# Patient Record
Sex: Male | Born: 1995 | Race: White | Hispanic: No | Marital: Single | Smoking: Never smoker
Health system: Southern US, Community
[De-identification: ages and names within clinical notes are randomized; demographics above are authoritative.]

## PROBLEM LIST (undated history)

## (undated) DIAGNOSIS — T7840XA Allergy, unspecified, initial encounter: Secondary | ICD-10-CM

## (undated) HISTORY — PX: OTHER SURGICAL HISTORY: SHX169

## (undated) HISTORY — DX: Allergy, unspecified, initial encounter: T78.40XA

## (undated) HISTORY — PX: TONSILLECTOMY: SUR1361

---
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
## 2015-08-18 ENCOUNTER — Emergency Department (HOSPITAL_COMMUNITY): Payer: Medicaid Other

## 2015-08-18 ENCOUNTER — Encounter (HOSPITAL_COMMUNITY): Payer: Self-pay | Admitting: Emergency Medicine

## 2015-08-18 ENCOUNTER — Emergency Department (HOSPITAL_COMMUNITY)
Admission: EM | Admit: 2015-08-18 | Discharge: 2015-08-19 | Disposition: A | Discharge status: Home / Self Care | Payer: Self-pay | Attending: Emergency Medicine | Admitting: Emergency Medicine

## 2015-08-18 DIAGNOSIS — R112 Nausea with vomiting, unspecified: Secondary | ICD-10-CM | POA: Insufficient documentation

## 2015-08-18 DIAGNOSIS — Z791 Long term (current) use of non-steroidal anti-inflammatories (NSAID): Secondary | ICD-10-CM | POA: Insufficient documentation

## 2015-08-18 LAB — CBC WITH DIFFERENTIAL/PLATELET
BASOS ABS: 0 10*3/uL (ref 0.0–0.1)
Basophils Relative: 1 % (ref 0–1)
EOS ABS: 0.2 10*3/uL (ref 0.0–0.7)
EOS PCT: 2 % (ref 0–5)
HCT: 45.2 % (ref 39.0–52.0)
Hemoglobin: 16.2 g/dL (ref 13.0–17.0)
Lymphocytes Relative: 31 % (ref 12–46)
Lymphs Abs: 2.3 10*3/uL (ref 0.7–4.0)
MCH: 31.4 pg (ref 26.0–34.0)
MCHC: 35.8 g/dL (ref 30.0–36.0)
MCV: 87.6 fL (ref 78.0–100.0)
MONO ABS: 0.5 10*3/uL (ref 0.1–1.0)
Monocytes Relative: 7 % (ref 3–12)
Neutro Abs: 4.4 10*3/uL (ref 1.7–7.7)
Neutrophils Relative %: 59 % (ref 43–77)
PLATELETS: 313 10*3/uL (ref 150–400)
RBC: 5.16 MIL/uL (ref 4.22–5.81)
RDW: 12.2 % (ref 11.5–15.5)
WBC: 7.3 10*3/uL (ref 4.0–10.5)

## 2015-08-18 LAB — COMPREHENSIVE METABOLIC PANEL
ALT: 19 U/L (ref 17–63)
AST: 19 U/L (ref 15–41)
Albumin: 5.2 g/dL — ABNORMAL HIGH (ref 3.5–5.0)
Alkaline Phosphatase: 50 U/L (ref 38–126)
Anion gap: 8 (ref 5–15)
BILIRUBIN TOTAL: 0.6 mg/dL (ref 0.3–1.2)
BUN: 11 mg/dL (ref 6–20)
CO2: 27 mmol/L (ref 22–32)
Calcium: 9.6 mg/dL (ref 8.9–10.3)
Chloride: 103 mmol/L (ref 101–111)
Creatinine, Ser: 0.75 mg/dL (ref 0.61–1.24)
GFR calc Af Amer: >60 mL/min (ref >60)
GFR calc non Af Amer: >60 mL/min (ref >60)
Glucose, Bld: 109 mg/dL — ABNORMAL HIGH (ref 65–99)
Potassium: 3.3 mmol/L — ABNORMAL LOW (ref 3.5–5.1)
Sodium: 138 mmol/L (ref 135–145)
Total Protein: 8.5 g/dL — ABNORMAL HIGH (ref 6.5–8.1)

## 2015-08-18 LAB — LIPASE, BLOOD: Lipase: 15 U/L — ABNORMAL LOW (ref 22–51)

## 2015-08-18 MED ORDER — ONDANSETRON HCL 4 MG PO TABS: 4.0000 mg | ORAL_TABLET | Freq: Three times a day (TID) | ORAL | Status: DC | PRN

## 2015-08-18 MED ORDER — FAMOTIDINE 20 MG PO TABS: 20.0000 mg | ORAL_TABLET | Freq: Two times a day (BID) | ORAL | Status: DC

## 2015-08-18 MED ORDER — ONDANSETRON 8 MG PO TBDP
8.0000 mg | ORAL_TABLET | Freq: Once | ORAL | Status: AC
  Administered 2015-08-18: 8 mg via ORAL
  Filled 2015-08-18: qty 1

## 2015-08-18 MED ORDER — POTASSIUM CHLORIDE CRYS ER 20 MEQ PO TBCR
40.0000 meq | EXTENDED_RELEASE_TABLET | Freq: Once | ORAL | Status: AC
  Administered 2015-08-18: 40 meq via ORAL
  Filled 2015-08-18: qty 2

## 2015-08-18 NOTE — ED Notes (Signed)
Pt states he has not been able to hold any food down for the last week. Denies pain or diarrhea

## 2015-08-18 NOTE — ED Provider Notes (Signed)
CSN: [id number]     Arrival date & time 08/18/15  1907 History   First MD Initiated Contact with Patient 08/18/15 2219     Chief Complaint  Patient presents with  . Emesis     HPI Pt was seen at 2225. Per pt, c/o gradual onset and persistence of multiple intermittent episodes of N/V for the past 3 to 4 days. States he "just get nauseated and throws up." Denies diarrhea, no abd pain, no CP/SOB, no back pain, no fevers, no black or blood in stools or emesis.   History reviewed. No pertinent past medical history.   Past Surgical History  Procedure Laterality Date  . Tonsillectomy    . Tubes in ears      Social History  Substance Use Topics  . Smoking status: Never Smoker   . Smokeless tobacco: None  . Alcohol Use: No    Review of Systems ROS: Statement: All systems negative except as marked or noted in the HPI; Constitutional: Negative for fever and chills. ; ; Eyes: Negative for eye pain, redness and discharge. ; ; ENMT: Negative for ear pain, hoarseness, nasal congestion, sinus pressure and sore throat. ; ; Cardiovascular: Negative for chest pain, palpitations, diaphoresis, dyspnea and peripheral edema. ; ; Respiratory: Negative for cough, wheezing and stridor. ; ; Gastrointestinal: +N/V. Negative for diarrhea, abdominal pain, blood in stool, hematemesis, jaundice and rectal bleeding. . ; ; Genitourinary: Negative for dysuria, flank pain and hematuria. ; ; Musculoskeletal: Negative for back pain and neck pain. Negative for swelling and trauma.; ; Skin: Negative for pruritus, rash, abrasions, blisters, bruising and skin lesion.; ; Neuro: Negative for headache, lightheadedness and neck stiffness. Negative for weakness, altered level of consciousness , altered mental status, extremity weakness, paresthesias, involuntary movement, seizure and syncope.       Allergies  Review of patient's allergies indicates no known allergies.  Home Medications   Prior to Admission medications    Medication Sig Start Date End Date Taking? Authorizing Provider  cyclobenzaprine (FLEXERIL) 10 MG tablet Take 1 tablet (10 mg total) by mouth 3 (three) times daily as needed. 10/27/13   Tammy Triplett, PA-C  naproxen (NAPROSYN) 500 MG tablet Take 1 tablet (500 mg total) by mouth 2 (two) times daily. Take with food 10/27/13   Tammy Triplett, PA-C   BP 131/87 mmHg  Pulse 74  Temp(Src) 97.6 F (36.4 C) (Oral)  Resp 20  Ht  (1.727 m)  Wt 142 lb (64.411 kg)  BMI 21.60 kg/m2  SpO2 100% Physical Exam  2230; Physical examination:  Nursing notes reviewed; Vital signs and O2 SAT reviewed;  Constitutional: Well developed, Well nourished, Well hydrated, In no acute distress; Head:  Normocephalic, atraumatic; Eyes: EOMI, PERRL, No scleral icterus; ENMT: Mouth and pharynx normal, Mucous membranes moist; Neck: Supple, Full range of motion, No lymphadenopathy; Cardiovascular: Regular rate and rhythm, No murmur, rub, or gallop; Respiratory: Breath sounds clear & equal bilaterally, No rales, rhonchi, wheezes.  Speaking full sentences with ease, Normal respiratory effort/excursion; Chest: Nontender, Movement normal; Abdomen: Soft, Nontender, Nondistended, Normal bowel sounds; Genitourinary: No CVA tenderness; Extremities: Pulses normal, No tenderness, No edema, No calf edema or asymmetry.; Neuro: AA&Ox3, Major CN grossly intact.  Speech clear. No gross focal motor or sensory deficits in extremities. Climbs on and off stretcher easily by himself. Gait steady.; Skin: Color normal, Warm, Dry.   ED Course  Procedures (including critical care time) Labs Review   Imaging Review  I have personally reviewed and evaluated  these images and lab results as part of my medical decision-making.   EKG Interpretation None      MDM  MDM Reviewed: previous chart, nursing note and vitals Reviewed previous: labs Interpretation: labs     Results for orders placed or performed during the hospital encounter of  08/18/15  Comprehensive metabolic panel  Result Value Ref Range   Sodium 138 135 - 145 mmol/L   Potassium 3.3 (L) 3.5 - 5.1 mmol/L   Chloride 103 101 - 111 mmol/L   CO2 27 22 - 32 mmol/L   Glucose, Bld 109 (H) 65 - 99 mg/dL   BUN 11 6 - 20 mg/dL   Creatinine, Ser 1.61 0.61 - 1.24 mg/dL   Calcium 9.6 8.9 - 09.6 mg/dL   Total Protein 8.5 (H) 6.5 - 8.1 g/dL   Albumin 5.2 (H) 3.5 - 5.0 g/dL   AST 19 15 - 41 U/L   ALT 19 17 - 63 U/L   Alkaline Phosphatase 50 38 - 126 U/L   Total Bilirubin 0.6 0.3 - 1.2 mg/dL   GFR calc non Af Amer >60 >60 mL/min   GFR calc Af Amer >60 >60 mL/min   Anion gap 8 5 - 15  Lipase, blood  Result Value Ref Range   Lipase 15 (L) 22 - 51 U/L  CBC with Differential  Result Value Ref Range   WBC 7.3 4.0 - 10.5 K/uL   RBC 5.16 4.22 - 5.81 MIL/uL   Hemoglobin 16.2 13.0 - 17.0 g/dL   HCT 04.5 40.9 - 81.1 %   MCV 87.6 78.0 - 100.0 fL   MCH 31.4 26.0 - 34.0 pg   MCHC 35.8 30.0 - 36.0 g/dL   RDW 91.4 78.2 - 95.6 %   Platelets 313 150 - 400 K/uL   Neutrophils Relative % 59 43 - 77 %   Neutro Abs 4.4 1.7 - 7.7 K/uL   Lymphocytes Relative 31 12 - 46 %   Lymphs Abs 2.3 0.7 - 4.0 K/uL   Monocytes Relative 7 3 - 12 %   Monocytes Absolute 0.5 0.1 - 1.0 K/uL   Eosinophils Relative 2 0 - 5 %   Eosinophils Absolute 0.2 0.0 - 0.7 K/uL   Basophils Relative 1 0 - 1 %   Basophils Absolute 0.0 0.0 - 0.1 K/uL   Dg Abd Acute W/chest 08/18/2015   CLINICAL DATA:  Nausea vomiting for 1 week.  EXAM: DG ABDOMEN ACUTE W/ 1V CHEST  COMPARISON:  None.  FINDINGS: There is no evidence of dilated bowel loops or free intraperitoneal air. No radiopaque calculi or other significant radiographic abnormality is seen. Heart size and mediastinal contours are within normal limits. Both lungs are clear.  IMPRESSION: Negative abdominal radiographs.  No acute cardiopulmonary disease.   Electronically Signed   By: Ellery Plunk M.D.   On: 08/18/2015 22:52    2340:  Potassium repleted PO. Pt  has tol PO well while in the ED without N/V.  No stooling while in the ED.  Abd remains benign, VSS. Feels better and wants to go home now. Dx and testing d/w pt and family.  Questions answered.  Verb understanding, agreeable to d/c home with outpt f/u.     Samuel Jester, DO 08/22/15 [phone]

## 2015-08-18 NOTE — Discharge Instructions (Signed)
°Emergency Department Resource Guide °1) Find a Doctor and Pay Out of Pocket °Although you won't have to find out who is covered by your insurance plan, it is a good idea to ask around and get recommendations. You will then need to call the office and see if the doctor you have chosen will accept you as a new patient and what types of options they offer for patients who are self-pay. Some doctors offer discounts or will set up payment plans for their patients who do not have insurance, but you will need to ask so you aren't surprised when you get to your appointment. ° °2) Contact Your Local Health Department °Not all health departments have doctors that can see patients for sick visits, but many do, so it is worth a call to see if yours does. If you don't know where your local health department is, you can check in your phone book. The CDC also has a tool to help you locate your state's health department, and many state websites also have listings of all of their local health departments. ° °3) Find a Walk-in Clinic °If your illness is not likely to be very severe or complicated, you may want to try a walk in clinic. These are popping up all over the country in pharmacies, drugstores, and shopping centers. They're usually staffed by nurse practitioners or physician assistants that have been trained to treat common illnesses and complaints. They're usually fairly quick and inexpensive. However, if you have serious medical issues or chronic medical problems, these are probably not your best option. ° °No Primary Care Doctor: °- Call [facility] at  832-8000 - they can help you locate a primary care doctor that  accepts your insurance, provides certain services, etc. °- Physician Referral Service- 1-800-533-3463 ° °Chronic Pain Problems: °Organization         Address  Phone   Notes  °[facility]  (336) 297-2271 Patients need to be referred by their primary care doctor.  ° °Medication  Assistance: °Organization         Address  Phone   Notes  °[place] [facility] [address] °[place], [place] [address] (336) 641-8030 --Must be a resident of [place] °-- Must have NO insurance coverage whatsoever (no Medicaid/ Medicare, etc.) °-- The pt. MUST have a primary care doctor that directs their care regularly and follows them in the community °  °[facility]  (866) 331-1348   °[employer]  (888) 892-1162   ° °Agencies that provide inexpensive medical care: °Organization         Address  Phone   Notes  °[facility]  (336) 832-8035   °[facility]    (336) 832-7272   °[facility] [address] °[place], [place] [address] (336) 832-4777   °[facility] [address], °[place] (336) 271-4999   °[facility]    (336) 373-0678   °[facility]    (336) 272-1050   °[facility] ° [address], [place] Phone:  (336) 832-4444, Fax:  (336) 832-4440 Hours of Operation:  9 am - 6 pm, M-F.  Also accepts Medicaid/Medicare and self-pay.  °[facility] ° [address], [place] Phone: (336) 832-3150, Fax: (336) [phone]. Hours of Operation:  8:30 am - 5:30 pm, M-F.  Also accepts Medicaid and self-pay.  °[facility] [place] [address], [place] Phone: (336) 878-6027   °[facility] [address], [place], [place] (336)723-1848, Ext. 123 Mondays & Thursdays: 7-9 AM.  First 15 patients are seen on a first come, first serve basis. °  ° °Medicaid-accepting [place] Providers: ° °Organization         Address  Phone   Notes  °[facility] [address], [place] (336) 641-2100 Also accepts self-pay patients.  °[facility] [address], [place] ° (336) 856-9996   °[facility] [address], [place]  (336) 288-8857   °[facility] [address] [place] [address], [place] (336) 299-7000   °Veita Bland [address], [place]  ° (336) 373-1557 Only accepts [place] Access Medicaid patients after they have their name applied to their card.  ° °Self-Pay (no insurance) in [place]: ° °Organization         Address  Phone   Notes  °Sickle Cell Patients, [facility] [address], [place] (336) 832-1970   °[facility] [address], [place] (336) 832-4400   °[facility] ° [address], [place] (336) 992-4800   °[facility]/Dr. Osei-Bonsu ° [address] [place] [address], [place] or [address], [place] (336) 841-8500 Phone number for both [place] and [place] locations is the same.  °[facility] [address], [place] (336) 299-0000   °[facility] [address] [place] [address], [place] or [address] (336) 852-7530 °(336) 878-2260   °[facility] [address], [place] (336) 350-1642, phone; (336) 294-5005, fax Sees patients 1st and 3rd Saturday of every month.  Must not qualify for public or private insurance (i.e. Medicaid, Medicare, [place] Health Choice, Veterans' Benefits) • Household income should be no more than 200% of the poverty level •The clinic cannot treat you if you are pregnant or think you are pregnant • Sexually transmitted diseases are not treated at the clinic.  ° ° °Dental Care: °Organization         Address  Phone  Notes  °[place] [facility] [address], [place] (336) 641-6152 Accepts children up to age 21 who are enrolled in Medicaid or [place] Health Choice; pregnant women with a Medicaid card; and children who have applied for Medicaid or [place] Health Choice, but were declined, whose parents can pay a reduced fee at time of service.  °[place]  [facility] [place]  [address], [place] (336) 641-7733 Accepts children up to age 21 who are enrolled in Medicaid or [place] Health Choice; pregnant women with a Medicaid card; and children who have applied for Medicaid or [place] Health Choice, but were declined, whose parents can pay a reduced fee at time of service.  °[facility] ° [address], [place] (336) 641-4533 Patients are seen by appointment only. Walk-ins are not accepted. [facility] will see patients 18 years of age and older. °Monday - Tuesday (8am-5pm) °Most Wednesdays (8:30-5pm) °$30 per visit, cash only  °[facility] ° [address], [place] (336) 641-4533 Patients are seen by appointment only. Walk-ins are not accepted. [facility] will see patients 18 years of age and older. °One   Wednesday Evening (Monthly: Volunteer Based).  $30 per visit, cash only  °[facility]  (919) 537-3737 for adults; Children under age 4, call Graduate Pediatric Dentistry at (919) [phone]. Children aged 4-14, please call (919) [phone] to request a pediatric application. ° Dental services are provided in all areas of dental care including fillings, crowns and bridges, complete and partial dentures, implants, gum treatment, root canals, and extractions. Preventive care is also provided. Treatment is provided to both adults and children. °Patients are selected via a lottery and there is often a waiting list. °  °[facility] [address], °[place] ° (336) 763-8833 [email or web address].[email or web address] °  °[facility] [address], [place], [place] (336)723-1848, Ext. 123 Second and Fourth Thursday of each month, opens at 6:30 AM; Clinic ends at 9 AM.  Patients are seen on a first-come first-served basis, and a limited number are seen during each clinic.  ° °[facility] ° [address], [place], [place] (336) 723-7904    Eligibility Requirements °You must have lived in [place], or [place] counties for at least the last three months. °  You cannot be eligible for state or federal sponsored healthcare insurance, including Veterans Administration, Medicaid, or Medicare. °  You generally cannot be eligible for healthcare insurance through your employer.  °  How to apply: °Eligibility screenings are held every Tuesday and Wednesday afternoon from 1:00 pm until 4:00 pm. You do not need an appointment for the interview!  °[facility] [address], [place], [place] 336-631-2330   °[facility]  336-342-8273   °[facility]  336-703-3100   °[facility]  336-570-6415   ° °Behavioral Health Resources in the Community: °Intensive Outpatient Programs °Organization         Address  Phone  Notes  °[facility] [address], [place], [place] 336-878-6098   °[facility] [address], [place], [place] 336-832-9800   °[facility] [address], [place], [place] ° 336-882-2125   °[facility] [address],  °[place], [place] 1-800-853-5163 or 336-641-4981   °Substance Abuse Resources °Organization         Address  Phone  Notes  °[facility]  336-882-2125   °[facility]  336-784-9470   °[facility]  336-285-9073   °[facility]  336-845-3988   °Residential & Outpatient Substance Abuse Program  1-800-659-3381   °Psychological Services °Organization         Address  Phone  Notes  °[facility]  336- 832-9600   °[facility]  336- 378-7881   °[facility] [address], [place] 1-800-853-5163 or 336-641-4981   ° °Mobile Crisis Teams °Organization         Address  Phone  Notes  °[facility], Mobile Crisis Care Unit  1-877-626-1772   °Assertive °[facility] ° [address]  [place], [place] 336-834-9664   °Sharon DeEsch [address] °[place] 336-554-5454   ° °Self-Help/Support Groups °Organization         Address  Phone             Notes  °[facility]  - variety of support groups  336- 373-1402 Call for more information  °Narcotics Anonymous (NA), [facility] [address], °[place]  2 meetings at this location  ° °  Residential Treatment Programs Organization         Address  Phone  Notes  [facility] [address]    [place]  1-610-960-4540   [facility]  [address] [place] [address], [place], [place] 478-295-6213   [facility] [address] [place], [place] 086-578-4696 Admissions: 8am-3pm M-F  [facility] [address],    [place], [place] 295-284-1324   [facility] [address] [place], [place], [place] 401-027-2536   [facility] [address],  [place], [place] 644-034-7425   [facility] Intensive Outpatient [address], [place], [place] 956-387-5643   [facility] ([facility] [address] [place].,  [place], [place] 3-295-188-4166 or (571)780-8777   [facility] ([facility] [address] [place], [place] 323-557-3220 Accepts Medicaid  [facility] [address],  [place] 2-542-706-2376 Substance Abuse/Addiction Treatment   [facility] Organization         Address  Phone  Notes  [facility]  (850) 814-2697   Angie Fava, PhD [address] [place], [place]   (575)388-8666 or (947) 515-7885   [facility]   [address] [place], [place] (601)[phone]   [facility] [address], [place], [place] [phone] Insurance/Medicaid/sponsorship through [facility] Families [address]                                    [place], [place] [phone] Therapy/tele-psych/case    [facility] [address], [place] (860) [phone]    Dr. Lolly Mustache  (260) [phone]   [facility]  [employer] [facility] Dept. 1) [address], [place] 2) [address], [place] 3)  [address], [place] [phone]  (225) [phone]   [facility] Child Abuse Hotline 606 532 3311 or (743) 666-0126 (After Hours)      Take the prescriptions as directed.  Increase your fluid intake (ie:  Gatoraide) for the next few days.  Eat a bland diet, avoiding greasy, fatty, fried foods, as well as spicy and acidic foods or beverages.  Avoid eating within the hour or 2 before going to bed or laying down.  Also avoid teas, colas, coffee, chocolate, pepermint and spearment.  Call your regular medical doctor and the GI doctor tomorrow to schedule a follow up appointment within the next week.  Return to the Emergency Department immediately if worsening.

## 2015-08-19 NOTE — ED Notes (Signed)
Patient verbalizes understanding of discharge instructions, prescription medications, home care and follow up care. Patient ambulatory out of department at this time with family. 

## 2016-08-11 ENCOUNTER — Emergency Department (HOSPITAL_COMMUNITY)
Admission: EM | Admit: 2016-08-11 | Discharge: 2016-08-11 | Disposition: A | Discharge status: Home / Self Care | Payer: No Typology Code available for payment source | Attending: Emergency Medicine | Admitting: Emergency Medicine

## 2016-08-11 ENCOUNTER — Encounter (HOSPITAL_COMMUNITY): Payer: Self-pay | Admitting: Emergency Medicine

## 2016-08-11 DIAGNOSIS — Y999 Unspecified external cause status: Secondary | ICD-10-CM | POA: Diagnosis not present

## 2016-08-11 DIAGNOSIS — Y939 Activity, unspecified: Secondary | ICD-10-CM | POA: Diagnosis not present

## 2016-08-11 DIAGNOSIS — M545 Low back pain: Secondary | ICD-10-CM | POA: Diagnosis present

## 2016-08-11 DIAGNOSIS — Y92410 Unspecified street and highway as the place of occurrence of the external cause: Secondary | ICD-10-CM | POA: Diagnosis not present

## 2016-08-11 DIAGNOSIS — S39012A Strain of muscle, fascia and tendon of lower back, initial encounter: Secondary | ICD-10-CM | POA: Diagnosis not present

## 2016-08-11 DIAGNOSIS — Z79899 Other long term (current) drug therapy: Secondary | ICD-10-CM | POA: Insufficient documentation

## 2016-08-11 MED ORDER — CYCLOBENZAPRINE HCL 10 MG PO TABS: 10.0000 mg | ORAL_TABLET | Freq: Three times a day (TID) | ORAL | 0 refills | Status: DC | PRN

## 2016-08-11 NOTE — Discharge Instructions (Signed)
Apply ice packs on/off to your back.  Take ibuprofen 600 to 800 mg with food three times a day as needed.  Follow-up with your doctor for recheck if needed
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
## 2016-10-19 ENCOUNTER — Ambulatory Visit: Payer: Self-pay | Admitting: Physician Assistant

## 2016-10-20 ENCOUNTER — Encounter: Payer: Self-pay | Admitting: Physician Assistant

## 2016-10-20 ENCOUNTER — Telehealth: Payer: Self-pay | Admitting: Physician Assistant

## 2016-10-20 NOTE — Telephone Encounter (Signed)
Patient had to go to urgent care on Wednesday and forgot to call and cancel appointment.

## 2017-04-02 ENCOUNTER — Other Ambulatory Visit: Payer: Self-pay | Admitting: Occupational Medicine

## 2017-04-02 ENCOUNTER — Ambulatory Visit: Payer: Self-pay

## 2017-04-02 DIAGNOSIS — M79644 Pain in right finger(s): Secondary | ICD-10-CM

## 2018-11-29 ENCOUNTER — Encounter: Payer: Self-pay | Admitting: Physician Assistant

## 2018-11-29 ENCOUNTER — Ambulatory Visit: Payer: Self-pay | Admitting: Physician Assistant

## 2018-11-29 VITALS — BP 125/77 | HR 85 | Temp 97.2°F | Ht 68.0 in | Wt 145.8 lb

## 2018-11-29 DIAGNOSIS — J301 Allergic rhinitis due to pollen: Secondary | ICD-10-CM

## 2018-11-29 DIAGNOSIS — K2100 Gastro-esophageal reflux disease with esophagitis, without bleeding: Secondary | ICD-10-CM

## 2018-11-29 MED ORDER — FLUTICASONE PROPIONATE 50 MCG/ACT NA SUSP: 2.0000 | Freq: Every day | NASAL | 6 refills | Status: DC

## 2018-11-29 MED ORDER — OMEPRAZOLE 20 MG PO CPDR
20.0000 mg | DELAYED_RELEASE_CAPSULE | Freq: Every day | ORAL | 5 refills | Status: DC
Start: 2018-11-29 — End: 2019-02-21

## 2018-11-29 MED ORDER — CETIRIZINE HCL 10 MG PO TABS
10.0000 mg | ORAL_TABLET | Freq: Every day | ORAL | 11 refills | Status: DC
Start: 2018-11-29 — End: 2021-03-25

## 2018-11-29 NOTE — Patient Instructions (Signed)

## 2018-11-29 NOTE — Progress Notes (Signed)
BP 125/77   Pulse 85   Temp (!) 97.2 F (36.2 C) (Oral)   Ht 5\' 8"  (1.727 m)   Wt 145 lb 12.8 oz (66.1 kg)   BMI 22.17 kg/m    Subjective:    Patient ID: Sean Gilbert, adult    DOB: 1996-03-01, 22 y.o.   MRN: [id number]  HPI: Sean Gilbert is a 22 y.o. adult presenting on 11/29/2018 for New Patient (Initial Visit); Sinus Problem; and Gastroesophageal Reflux  Patient comes in as a new patient to be established.  He has had longstanding sinus and allergy problems.  Years ago he used to take right many medicines but has been able to go without them.  However at this time he is having a significant amount of sinus congestion most all the time.  He is tried Claritin without any relief.  Casimer BilisSerena stop that medication.  He has not used Flonase in some time.  And he has never taken Zyrtec before.  In addition he is having significant reflux symptoms of epigastric pain and even up into the esophagus.  At times he does have difficulty with swallowing.  He will retakes food.  History reviewed. No pertinent past medical history. Relevant past medical, surgical, family and social history reviewed and updated as indicated. Interim medical history since our last visit reviewed. Allergies and medications reviewed and updated. DATA REVIEWED: CHART IN EPIC  Family History reviewed for pertinent findings.  Review of Systems  Constitutional: Negative.  Negative for appetite change and fatigue.  HENT: Positive for congestion, postnasal drip and rhinorrhea.   Eyes: Negative.  Negative for pain and visual disturbance.  Respiratory: Negative.  Negative for cough, chest tightness, shortness of breath and wheezing.   Cardiovascular: Negative.  Negative for chest pain, palpitations and leg swelling.  Gastrointestinal: Negative.  Negative for abdominal pain, diarrhea, nausea and vomiting.  Endocrine: Negative.   Genitourinary: Negative.   Musculoskeletal: Negative.   Skin: Negative.  Negative  for color change and rash.  Neurological: Negative.  Negative for weakness, numbness and headaches.  Psychiatric/Behavioral: Negative.     Allergies as of 11/29/2018   No Known Allergies     Medication List       Accurate as of November 29, 2018  5:57 PM. Always use your most recent med list.        cetirizine 10 MG tablet Commonly known as:  ZYRTEC Take 1 tablet (10 mg total) by mouth daily.   fluticasone 50 MCG/ACT nasal spray Commonly known as:  FLONASE Place 2 sprays into both nostrils daily.   lansoprazole 15 MG capsule Commonly known as:  PREVACID Take 15 mg by mouth daily at 12 noon.   omeprazole 20 MG capsule Commonly known as:  PRILOSEC Take 1 capsule (20 mg total) by mouth daily.          Objective:    BP 125/77   Pulse 85   Temp (!) 97.2 F (36.2 C) (Oral)   Ht 5\' 8"  (1.727 m)   Wt 145 lb 12.8 oz (66.1 kg)   BMI 22.17 kg/m   No Known Allergies  Wt Readings from Last 3 Encounters:  11/29/18 145 lb 12.8 oz (66.1 kg)  08/11/16 150 lb (68 kg)  08/18/15 142 lb (64.4 kg) (28 %, Z= -0.59)*   * Growth percentiles are based on CDC (Boys, 2-20 Years) data.    Physical Exam Constitutional:      Appearance: She is well-developed.  HENT:  Head: Normocephalic and atraumatic.     Nose: Congestion and rhinorrhea present. No mucosal edema.  Eyes:     Conjunctiva/sclera: Conjunctivae normal.     Pupils: Pupils are equal, round, and reactive to light.  Neck:     Musculoskeletal: Normal range of motion and neck supple.  Cardiovascular:     Rate and Rhythm: Normal rate and regular rhythm.     Heart sounds: Normal heart sounds.  Pulmonary:     Effort: Pulmonary effort is normal.     Breath sounds: Normal breath sounds.  Abdominal:     General: Bowel sounds are normal.     Palpations: Abdomen is soft.  Musculoskeletal: Normal range of motion.  Skin:    General: Skin is warm and dry.         Assessment & Plan:   1. Gastroesophageal reflux  disease with esophagitis - lansoprazole (PREVACID) 15 MG capsule; Take 15 mg by mouth daily at 12 noon. Stop this medication  - omeprazole (PRILOSEC) 20 MG capsule; Take 1 capsule (20 mg total) by mouth daily.  Dispense: 30 capsule; Refill: 5  2. Non-seasonal allergic rhinitis due to pollen - fluticasone (FLONASE) 50 MCG/ACT nasal spray; Place 2 sprays into both nostrils daily.  Dispense: 16 g; Refill: 6 - cetirizine (ZYRTEC) 10 MG tablet; Take 1 tablet (10 mg total) by mouth daily.  Dispense: 30 tablet; Refill: 11   Continue all other maintenance medications as listed above.  Follow up plan: Return in about 4 weeks (around 12/27/2018) for recheck.  Educational handout given for survey  Remus Loffler PA-C [facility] [address]  [place], [place] [address] [phone]   11/29/2018, 5:57 PM

## 2018-12-16 ENCOUNTER — Encounter: Payer: Self-pay | Admitting: Family Medicine

## 2018-12-16 ENCOUNTER — Ambulatory Visit (INDEPENDENT_AMBULATORY_CARE_PROVIDER_SITE_OTHER): Payer: 59 | Admitting: Family Medicine

## 2018-12-16 VITALS — BP 119/78 | HR 87 | Temp 97.1°F | Ht 68.0 in | Wt 147.8 lb

## 2018-12-16 DIAGNOSIS — R0789 Other chest pain: Secondary | ICD-10-CM

## 2018-12-16 DIAGNOSIS — R071 Chest pain on breathing: Secondary | ICD-10-CM

## 2018-12-16 MED ORDER — PREDNISONE 10 MG PO TABS: ORAL_TABLET | ORAL | 0 refills | Status: DC

## 2018-12-16 NOTE — Progress Notes (Signed)
Chief Complaint  Patient presents with  . pain left chest, muscle strain?    HPI  Patient presents today for started 3 days ago with a sharp pain at the left sternal border.  It started out as twinges.  Yesterday it got so severe that he could not lift at all.  He had to leave work.  He was off today.  Lifting anything more than a pound or 2 causes intense pain in the left side of the chest patient points to the area of the costochondral joints on the left.  He says it does hurt when he tries to take a deep breath.  PMH: Smoking status noted ROS: Per HPI  Objective: BP 119/78   Pulse 87   Temp (!) 97.1 F (36.2 C) (Oral)   Ht 5\' 8"  (1.727 m)   Wt 147 lb 12.8 oz (67 kg)   BMI 22.47 kg/m  Gen: NAD, alert, cooperative with exam HEENT: NCAT, EOMI, PERRL CV: RRR, good S1/S2, no murmur Resp: CTABL, no wheezes, non-labored.  There is some tenderness for sternal compression Ext: No edema, warm Neuro: Alert and oriented, No gross deficits  Assessment and plan:  1. Costochondral chest pain     Meds ordered this encounter  Medications  . predniSONE (DELTASONE) 10 MG tablet    Sig: Take 5 daily for 3 days followed by 4,3,2 and 1 for 3 days each.    Dispense:  45 tablet    Refill:  0      Follow up as needed.  Mechele ClaudeWarren Marky Buresh, MD

## 2018-12-19 ENCOUNTER — Telehealth: Payer: Self-pay | Admitting: Physician Assistant

## 2018-12-19 NOTE — Telephone Encounter (Signed)
Ok to extend note. If his symptoms are worse, he does need to be reevaluated.

## 2018-12-19 NOTE — Telephone Encounter (Signed)
Pt was given letter to be out of work 12/29-12/30 and to return 12/31 when he was seen with Dr Darlyn Read. Ok to do another letter for being out 12/29-1/6 or does he ntbs again?

## 2018-12-19 NOTE — Telephone Encounter (Signed)
Pt advised of provider feedback and letter printed and ready for pick up.

## 2018-12-25 ENCOUNTER — Encounter: Payer: Self-pay | Admitting: Family Medicine

## 2018-12-25 ENCOUNTER — Ambulatory Visit (INDEPENDENT_AMBULATORY_CARE_PROVIDER_SITE_OTHER): Payer: 59 | Admitting: Family Medicine

## 2018-12-25 ENCOUNTER — Other Ambulatory Visit: Payer: Self-pay | Admitting: Family Medicine

## 2018-12-25 ENCOUNTER — Ambulatory Visit (INDEPENDENT_AMBULATORY_CARE_PROVIDER_SITE_OTHER): Payer: 59

## 2018-12-25 VITALS — BP 131/88 | HR 94 | Temp 97.0°F | Ht 68.0 in | Wt 150.6 lb

## 2018-12-25 DIAGNOSIS — Z029 Encounter for administrative examinations, unspecified: Secondary | ICD-10-CM

## 2018-12-25 DIAGNOSIS — S29011A Strain of muscle and tendon of front wall of thorax, initial encounter: Secondary | ICD-10-CM

## 2018-12-25 DIAGNOSIS — S96919D Strain of unspecified muscle and tendon at ankle and foot level, unspecified foot, subsequent encounter: Secondary | ICD-10-CM

## 2018-12-25 MED ORDER — TIZANIDINE HCL 2 MG PO CAPS
2.0000 mg | ORAL_CAPSULE | Freq: Three times a day (TID) | ORAL | 1 refills | Status: DC
Start: 2018-12-25 — End: 2019-08-08

## 2018-12-25 NOTE — Progress Notes (Addendum)
BP 131/88   Pulse 94   Temp (!) 97 F (36.1 C) (Oral)   Ht 5\' 8"  (1.727 m)   Wt 150 lb 9.6 oz (68.3 kg)   BMI 22.90 kg/m    Subjective:    Patient ID: Sean Gilbert, male    DOB: 05-15-1996, 23 y.o.   MRN: [id number]  HPI: Sean FantiChristopher S Seabolt is a 23 y.o. male presenting on 12/25/2018 for Chest Pain (Patient was seen 12/30 and states he feels like his chest pain has spread)   HPI Patient is coming in complaining of chest wall pain on the left side that sometimes comes over the right side as well that he has been having for a week now.  He says he was at work lifting a lot of things and that it started hurting on that day that he came in on the 30th and was seen and given prednisone.  Has improved since the prednisone but not resolved and is not as sharp but it is still there some.  He says it is worse with movement and deep inspiration and also with movement of his left arm and lifting things at work.  He did try to go back to work yesterday but it did not go well.  Relevant past medical, surgical, family and social history reviewed and updated as indicated. Interim medical history since our last visit reviewed. Allergies and medications reviewed and updated.  Review of Systems  Constitutional: Negative for chills and fever.  Respiratory: Negative for shortness of breath and wheezing.   Cardiovascular: Positive for chest pain. Negative for leg swelling.  Musculoskeletal: Negative for back pain and gait problem.  Skin: Negative for rash.  All other systems reviewed and are negative.   Per HPI unless specifically indicated above   Allergies as of 12/25/2018   No Known Allergies     Medication List       Accurate as of December 25, 2018 11:59 PM. Always use your most recent med list.        cetirizine 10 MG tablet Commonly known as:  ZYRTEC Take 1 tablet (10 mg total) by mouth daily.   fluticasone 50 MCG/ACT nasal spray Commonly known as:  FLONASE Place 2 sprays  into both nostrils daily.   lansoprazole 15 MG capsule Commonly known as:  PREVACID Take 15 mg by mouth daily at 12 noon.   omeprazole 20 MG capsule Commonly known as:  PRILOSEC Take 1 capsule (20 mg total) by mouth daily.   predniSONE 10 MG tablet Commonly known as:  DELTASONE Take 5 daily for 3 days followed by 4,3,2 and 1 for 3 days each.   tizanidine 2 MG capsule Commonly known as:  ZANAFLEX Take 1 capsule (2 mg total) by mouth 3 (three) times daily.          Objective:    BP 131/88   Pulse 94   Temp (!) 97 F (36.1 C) (Oral)   Ht 5\' 8"  (1.727 m)   Wt 150 lb 9.6 oz (68.3 kg)   BMI 22.90 kg/m   Wt Readings from Last 3 Encounters:  12/25/18 150 lb 9.6 oz (68.3 kg)  12/16/18 147 lb 12.8 oz (67 kg)  11/29/18 145 lb 12.8 oz (66.1 kg)    Physical Exam Vitals signs and nursing note reviewed.  Constitutional:      General: He is not in acute distress.    Appearance: He is well-developed. He is not diaphoretic.  Eyes:  General: No scleral icterus.    Conjunctiva/sclera: Conjunctivae normal.  Neck:     Musculoskeletal: Neck supple.     Thyroid: No thyromegaly.  Cardiovascular:     Rate and Rhythm: Normal rate and regular rhythm.     Heart sounds: Normal heart sounds. No murmur.  Pulmonary:     Effort: Pulmonary effort is normal. No respiratory distress.     Breath sounds: Normal breath sounds. No wheezing.  Chest:     Chest wall: Tenderness (Left lower chest wall tenderness to palpation, likely musculoskeletal in origin) present.  Musculoskeletal: Normal range of motion.  Lymphadenopathy:     Cervical: No cervical adenopathy.  Skin:    General: Skin is warm and dry.     Findings: No rash.  Neurological:     Mental Status: He is alert and oriented to person, place, and time.     Coordination: Coordination normal.  Psychiatric:        Behavior: Behavior normal.     Chest x-ray: No acute bony abnormalities or cardiopulmonary abnormalities, await final  read from radiologist    Assessment & Plan:   Problem List Items Addressed This Visit    None    Visit Diagnoses    Muscle strain of chest wall, initial encounter    -  Primary   Relevant Medications   tizanidine (ZANAFLEX) 2 MG capsule      Will give 1 more week off work or light duty if they can accommodate him.  Use the muscle relaxer and if still causing a lot of issues in a week then we can send him to sports medicine or physical therapy. Follow up plan: Return if symptoms worsen or fail to improve.  Counseling provided for all of the vaccine components No orders of the defined types were placed in this encounter.   Arville Care, MD [facility] 12/26/2018, 3:16 PM

## 2018-12-26 ENCOUNTER — Telehealth: Payer: Self-pay | Admitting: Physician Assistant

## 2018-12-26 NOTE — Telephone Encounter (Signed)
Form has been received.

## 2018-12-31 ENCOUNTER — Ambulatory Visit (INDEPENDENT_AMBULATORY_CARE_PROVIDER_SITE_OTHER): Payer: 59 | Admitting: Physician Assistant

## 2018-12-31 ENCOUNTER — Encounter: Payer: Self-pay | Admitting: Physician Assistant

## 2018-12-31 VITALS — BP 126/81 | HR 68 | Temp 96.3°F | Ht 68.0 in | Wt 150.0 lb

## 2018-12-31 DIAGNOSIS — K2100 Gastro-esophageal reflux disease with esophagitis, without bleeding: Secondary | ICD-10-CM

## 2018-12-31 DIAGNOSIS — G4700 Insomnia, unspecified: Secondary | ICD-10-CM | POA: Diagnosis not present

## 2018-12-31 DIAGNOSIS — G4726 Circadian rhythm sleep disorder, shift work type: Secondary | ICD-10-CM

## 2018-12-31 DIAGNOSIS — R002 Palpitations: Secondary | ICD-10-CM | POA: Diagnosis not present

## 2018-12-31 DIAGNOSIS — J301 Allergic rhinitis due to pollen: Secondary | ICD-10-CM

## 2018-12-31 NOTE — Progress Notes (Signed)
BP 126/81   Pulse 68   Temp (!) 96.3 F (35.7 C) (Oral)   Ht 5\' 8"  (1.727 m)   Wt 150 lb (68 kg)   BMI 22.81 kg/m    Subjective:    Patient ID: Sean Gilbert, male    DOB: February 24, 1996, 23 y.o.   MRN: [id number]  HPI: Sean Gilbert is a 23 y.o. male presenting on 12/31/2018 for Gastroesophageal Reflux (4 week follow up)  This patient comes in for recheck on his GERD and states that it has improved and when he has been off of night shift it has been better. However he is having more issues whenever he works the night shift.  He is having significant palpitations and states that he works at night and is having significant issues with his sleep.  He had always had a little bit of problem with insomnia but since working night shift it is gotten much worse.  I do think he should try to get the first shift as soon as he can.  He states that we can send a letter to his work and we will complete some paperwork for him when he gets into our office.   History reviewed. No pertinent past medical history. Relevant past medical, surgical, family and social history reviewed and updated as indicated. Interim medical history since our last visit reviewed. Allergies and medications reviewed and updated. DATA REVIEWED: CHART IN EPIC  Family History reviewed for pertinent findings.  Review of Systems  Constitutional: Positive for fatigue. Negative for appetite change.  HENT: Negative.   Eyes: Negative.  Negative for pain and visual disturbance.  Respiratory: Negative.  Negative for cough, chest tightness, shortness of breath and wheezing.   Cardiovascular: Positive for chest pain and palpitations. Negative for leg swelling.  Gastrointestinal: Negative.  Negative for abdominal pain, diarrhea, nausea and vomiting.  Endocrine: Negative.   Genitourinary: Negative.   Musculoskeletal: Positive for arthralgias.  Skin: Negative.  Negative for color change and rash.  Neurological:  Negative.  Negative for weakness, numbness and headaches.  Psychiatric/Behavioral: Positive for sleep disturbance. Negative for suicidal ideas. The patient is nervous/anxious.     Allergies as of 12/31/2018   No Known Allergies     Medication List       Accurate as of December 31, 2018 11:59 PM. Always use your most recent med list.        cetirizine 10 MG tablet Commonly known as:  ZYRTEC Take 1 tablet (10 mg total) by mouth daily.   fluticasone 50 MCG/ACT nasal spray Commonly known as:  FLONASE Place 2 sprays into both nostrils daily.   lansoprazole 15 MG capsule Commonly known as:  PREVACID Take 15 mg by mouth daily at 12 noon.   omeprazole 20 MG capsule Commonly known as:  PRILOSEC Take 1 capsule (20 mg total) by mouth daily.   predniSONE 10 MG tablet Commonly known as:  DELTASONE Take 5 daily for 3 days followed by 4,3,2 and 1 for 3 days each.   tizanidine 2 MG capsule Commonly known as:  ZANAFLEX Take 1 capsule (2 mg total) by mouth 3 (three) times daily.          Objective:    BP 126/81   Pulse 68   Temp (!) 96.3 F (35.7 C) (Oral)   Ht 5\' 8"  (1.727 m)   Wt 150 lb (68 kg)   BMI 22.81 kg/m   No Known Allergies  Wt Readings from Last  3 Encounters:  12/31/18 150 lb (68 kg)  12/25/18 150 lb 9.6 oz (68.3 kg)  12/16/18 147 lb 12.8 oz (67 kg)    Physical Exam Constitutional:      Appearance: He is well-developed.  HENT:     Head: Normocephalic and atraumatic.  Eyes:     Conjunctiva/sclera: Conjunctivae normal.     Pupils: Pupils are equal, round, and reactive to light.  Neck:     Musculoskeletal: Normal range of motion and neck supple.  Cardiovascular:     Rate and Rhythm: Normal rate and regular rhythm.     Heart sounds: Normal heart sounds.  Pulmonary:     Effort: Pulmonary effort is normal.     Breath sounds: Normal breath sounds.  Abdominal:     General: Bowel sounds are normal.     Palpations: Abdomen is soft.  Musculoskeletal: Normal  range of motion.  Skin:    General: Skin is warm and dry.         Assessment & Plan:   1. Palpitations Try to get off night shift  2. Insomnia, unspecified type Try to get off night shift  3. Shift work sleep disorder Try to get off night shift  4. Gastroesophageal reflux disease with esophagitis Try to get off night shift  5. Non-seasonal allergic rhinitis due to pollen Try to get off night shift   Continue all other maintenance medications as listed above.  Follow up plan: Return in about 3 months (around 04/01/2019).  Educational handout given for survey  Remus Loffler PA-C [facility] [address]  [place], [place] [address] [phone]   01/03/2019, 10:31 AM

## 2019-01-01 ENCOUNTER — Other Ambulatory Visit: Payer: Self-pay | Admitting: *Deleted

## 2019-01-01 NOTE — Telephone Encounter (Signed)
erroneous

## 2019-01-02 ENCOUNTER — Telehealth: Payer: Self-pay | Admitting: Physician Assistant

## 2019-01-02 NOTE — Telephone Encounter (Signed)
Form is ready, pt aware

## 2019-01-03 ENCOUNTER — Other Ambulatory Visit: Payer: Self-pay | Admitting: Physician Assistant

## 2019-01-03 DIAGNOSIS — G4700 Insomnia, unspecified: Secondary | ICD-10-CM | POA: Insufficient documentation

## 2019-01-03 DIAGNOSIS — R002 Palpitations: Secondary | ICD-10-CM | POA: Insufficient documentation

## 2019-01-03 DIAGNOSIS — G4726 Circadian rhythm sleep disorder, shift work type: Secondary | ICD-10-CM | POA: Insufficient documentation

## 2019-01-23 ENCOUNTER — Ambulatory Visit (INDEPENDENT_AMBULATORY_CARE_PROVIDER_SITE_OTHER): Payer: 59 | Admitting: Physician Assistant

## 2019-01-23 ENCOUNTER — Encounter: Payer: Self-pay | Admitting: Physician Assistant

## 2019-01-23 VITALS — BP 129/88 | HR 82 | Temp 97.8°F | Ht 68.0 in | Wt 150.4 lb

## 2019-01-23 DIAGNOSIS — R030 Elevated blood-pressure reading, without diagnosis of hypertension: Secondary | ICD-10-CM

## 2019-01-23 DIAGNOSIS — K2100 Gastro-esophageal reflux disease with esophagitis, without bleeding: Secondary | ICD-10-CM

## 2019-01-23 MED ORDER — PANTOPRAZOLE SODIUM 20 MG PO TBEC: 20.0000 mg | DELAYED_RELEASE_TABLET | Freq: Every day | ORAL | 11 refills | Status: DC

## 2019-01-23 NOTE — Patient Instructions (Signed)
DASH Eating Plan  DASH stands for "Dietary Approaches to Stop Hypertension." The DASH eating plan is a healthy eating plan that has been shown to reduce high blood pressure (hypertension). It may also reduce your risk for type 2 diabetes, heart disease, and stroke. The DASH eating plan may also help with weight loss.  What are tips for following this plan?    General guidelines   Avoid eating more than 2,300 mg (milligrams) of salt (sodium) a day. If you have hypertension, you may need to reduce your sodium intake to 1,500 mg a day.   Limit alcohol intake to no more than 1 drink a day for nonpregnant women and 2 drinks a day for men. One drink equals 12 oz of beer, 5 oz of wine, or 1 oz of hard liquor.   Work with your health care provider to maintain a healthy body weight or to lose weight. Ask what an ideal weight is for you.   Get at least 30 minutes of exercise that causes your heart to beat faster (aerobic exercise) most days of the week. Activities may include walking, swimming, or biking.   Work with your health care provider or diet and nutrition specialist (dietitian) to adjust your eating plan to your individual calorie needs.  Reading food labels     Check food labels for the amount of sodium per serving. Choose foods with less than 5 percent of the Daily Value of sodium. Generally, foods with less than 300 mg of sodium per serving fit into this eating plan.   To find whole grains, look for the word "whole" as the first word in the ingredient list.  Shopping   Buy products labeled as "low-sodium" or "no salt added."   Buy fresh foods. Avoid canned foods and premade or frozen meals.  Cooking   Avoid adding salt when cooking. Use salt-free seasonings or herbs instead of table salt or sea salt. Check with your health care provider or pharmacist before using salt substitutes.   Do not fry foods. Cook foods using healthy methods such as baking, boiling, grilling, and broiling instead.   Cook with  heart-healthy oils, such as olive, canola, soybean, or sunflower oil.  Meal planning   Eat a balanced diet that includes:  ? 5 or more servings of fruits and vegetables each day. At each meal, try to fill half of your plate with fruits and vegetables.  ? Up to 6-8 servings of whole grains each day.  ? Less than 6 oz of lean meat, poultry, or fish each day. A 3-oz serving of meat is about the same size as a deck of cards. One egg equals 1 oz.  ? 2 servings of low-fat dairy each day.  ? A serving of nuts, seeds, or beans 5 times each week.  ? Heart-healthy fats. Healthy fats called Omega-3 fatty acids are found in foods such as flaxseeds and coldwater fish, like sardines, salmon, and mackerel.   Limit how much you eat of the following:  ? Canned or prepackaged foods.  ? Food that is high in trans fat, such as fried foods.  ? Food that is high in saturated fat, such as fatty meat.  ? Sweets, desserts, sugary drinks, and other foods with added sugar.  ? Full-fat dairy products.   Do not salt foods before eating.   Try to eat at least 2 vegetarian meals each week.   Eat more home-cooked food and less restaurant, buffet, and fast food.     When eating at a restaurant, ask that your food be prepared with less salt or no salt, if possible.  What foods are recommended?  The items listed may not be a complete list. Talk with your dietitian about what dietary choices are best for you.  Grains  Whole-grain or whole-wheat bread. Whole-grain or whole-wheat pasta. Brown rice. Oatmeal. Quinoa. Bulgur. Whole-grain and low-sodium cereals. Pita bread. Low-fat, low-sodium crackers. Whole-wheat flour tortillas.  Vegetables  Fresh or frozen vegetables (raw, steamed, roasted, or grilled). Low-sodium or reduced-sodium tomato and vegetable juice. Low-sodium or reduced-sodium tomato sauce and tomato paste. Low-sodium or reduced-sodium canned vegetables.  Fruits  All fresh, dried, or frozen fruit. Canned fruit in natural juice (without  added sugar).  Meat and other protein foods  Skinless chicken or turkey. Ground chicken or turkey. Pork with fat trimmed off. Fish and seafood. Egg whites. Dried beans, peas, or lentils. Unsalted nuts, nut butters, and seeds. Unsalted canned beans. Lean cuts of beef with fat trimmed off. Low-sodium, lean deli meat.  Dairy  Low-fat (1%) or fat-free (skim) milk. Fat-free, low-fat, or reduced-fat cheeses. Nonfat, low-sodium ricotta or cottage cheese. Low-fat or nonfat yogurt. Low-fat, low-sodium cheese.  Fats and oils  Soft margarine without trans fats. Vegetable oil. Low-fat, reduced-fat, or light mayonnaise and salad dressings (reduced-sodium). Canola, safflower, olive, soybean, and sunflower oils. Avocado.  Seasoning and other foods  Herbs. Spices. Seasoning mixes without salt. Unsalted popcorn and pretzels. Fat-free sweets.  What foods are not recommended?  The items listed may not be a complete list. Talk with your dietitian about what dietary choices are best for you.  Grains  Baked goods made with fat, such as croissants, muffins, or some breads. Dry pasta or rice meal packs.  Vegetables  Creamed or fried vegetables. Vegetables in a cheese sauce. Regular canned vegetables (not low-sodium or reduced-sodium). Regular canned tomato sauce and paste (not low-sodium or reduced-sodium). Regular tomato and vegetable juice (not low-sodium or reduced-sodium). Pickles. Olives.  Fruits  Canned fruit in a light or heavy syrup. Fried fruit. Fruit in cream or butter sauce.  Meat and other protein foods  Fatty cuts of meat. Ribs. Fried meat. Bacon. Sausage. Bologna and other processed lunch meats. Salami. Fatback. Hotdogs. Bratwurst. Salted nuts and seeds. Canned beans with added salt. Canned or smoked fish. Whole eggs or egg yolks. Chicken or turkey with skin.  Dairy  Whole or 2% milk, cream, and half-and-half. Whole or full-fat cream cheese. Whole-fat or sweetened yogurt. Full-fat cheese. Nondairy creamers. Whipped toppings.  Processed cheese and cheese spreads.  Fats and oils  Butter. Stick margarine. Lard. Shortening. Ghee. Bacon fat. Tropical oils, such as coconut, palm kernel, or palm oil.  Seasoning and other foods  Salted popcorn and pretzels. Onion salt, garlic salt, seasoned salt, table salt, and sea salt. Worcestershire sauce. Tartar sauce. Barbecue sauce. Teriyaki sauce. Soy sauce, including reduced-sodium. Steak sauce. Canned and packaged gravies. Fish sauce. Oyster sauce. Cocktail sauce. Horseradish that you find on the shelf. Ketchup. Mustard. Meat flavorings and tenderizers. Bouillon cubes. Hot sauce and Tabasco sauce. Premade or packaged marinades. Premade or packaged taco seasonings. Relishes. Regular salad dressings.  Where to find more information:   National Heart, Lung, and Blood Institute: www.nhlbi.nih.gov   American Heart Association: www.heart.org  Summary   The DASH eating plan is a healthy eating plan that has been shown to reduce high blood pressure (hypertension). It may also reduce your risk for type 2 diabetes, heart disease, and stroke.   With the   DASH eating plan, you should limit salt (sodium) intake to 2,300 mg a day. If you have hypertension, you may need to reduce your sodium intake to 1,500 mg a day.   When on the DASH eating plan, aim to eat more fresh fruits and vegetables, whole grains, lean proteins, low-fat dairy, and heart-healthy fats.   Work with your health care provider or diet and nutrition specialist (dietitian) to adjust your eating plan to your individual calorie needs.  This information is not intended to replace advice given to you by your health care provider. Make sure you discuss any questions you have with your health care provider.  Document Released: 11/23/2011 Document Revised: 11/27/2016 Document Reviewed: 11/27/2016  [employer]vier Interactive Patient Education  2019 [employer]

## 2019-01-24 NOTE — Progress Notes (Signed)
BP 129/88   Pulse 82   Temp 97.8 F (36.6 C) (Oral)   Ht 5\' 8"  (1.727 m)   Wt 150 lb 6.4 oz (68.2 kg)   BMI 22.87 kg/m    Subjective:    Patient ID: Sean Gilbert, male    DOB: 1996-05-05, 23 y.o.   MRN: [id number]  HPI: Sean FantiChristopher S Goold is a 23 y.o. male presenting on 01/23/2019 for Hypertension  He had some elevated readings while at his workman's comp visits. He stopped omeprazole since he read that it could increase BP. His readings have gone down since then. He does need another mediation for the GERD.  History reviewed. No pertinent past medical history. Relevant past medical, surgical, family and social history reviewed and updated as indicated. Interim medical history since our last visit reviewed. Allergies and medications reviewed and updated. DATA REVIEWED: CHART IN EPIC  Family History reviewed for pertinent findings.  Review of Systems  Constitutional: Negative.  Negative for appetite change and fatigue.  Eyes: Negative for pain and visual disturbance.  Respiratory: Negative.  Negative for cough, chest tightness, shortness of breath and wheezing.   Cardiovascular: Negative.  Negative for chest pain, palpitations and leg swelling.  Gastrointestinal: Negative.  Negative for abdominal pain, diarrhea, nausea and vomiting.  Genitourinary: Negative.   Skin: Negative.  Negative for color change and rash.  Neurological: Negative.  Negative for weakness, numbness and headaches.  Psychiatric/Behavioral: Negative.     Allergies as of 01/23/2019   No Known Allergies     Medication List       Accurate as of January 23, 2019 11:59 PM. Always use your most recent med list.        cetirizine 10 MG tablet Commonly known as:  ZYRTEC Take 1 tablet (10 mg total) by mouth daily.   fluticasone 50 MCG/ACT nasal spray Commonly known as:  FLONASE Place 2 sprays into both nostrils daily.   lansoprazole 15 MG capsule Commonly known as:  PREVACID Take 15 mg by  mouth daily at 12 noon.   omeprazole 20 MG capsule Commonly known as:  PRILOSEC Take 1 capsule (20 mg total) by mouth daily.   pantoprazole 20 MG tablet Commonly known as:  PROTONIX Take 1 tablet (20 mg total) by mouth daily.   tizanidine 2 MG capsule Commonly known as:  ZANAFLEX Take 1 capsule (2 mg total) by mouth 3 (three) times daily.          Objective:    BP 129/88   Pulse 82   Temp 97.8 F (36.6 C) (Oral)   Ht 5\' 8"  (1.727 m)   Wt 150 lb 6.4 oz (68.2 kg)   BMI 22.87 kg/m   No Known Allergies  Wt Readings from Last 3 Encounters:  01/23/19 150 lb 6.4 oz (68.2 kg)  12/31/18 150 lb (68 kg)  12/25/18 150 lb 9.6 oz (68.3 kg)    Physical Exam Vitals signs and nursing note reviewed.  Constitutional:      General: He is not in acute distress.    Appearance: He is well-developed.  HENT:     Head: Normocephalic and atraumatic.  Eyes:     Conjunctiva/sclera: Conjunctivae normal.     Pupils: Pupils are equal, round, and reactive to light.  Cardiovascular:     Rate and Rhythm: Normal rate and regular rhythm.     Heart sounds: Normal heart sounds.  Pulmonary:     Effort: Pulmonary effort is normal. No respiratory distress.  Breath sounds: Normal breath sounds.  Skin:    General: Skin is warm and dry.  Psychiatric:        Behavior: Behavior normal.     Results for orders placed or performed during the hospital encounter of 08/18/15  Comprehensive metabolic panel  Result Value Ref Range   Sodium 138 135 - 145 mmol/L   Potassium 3.3 (L) 3.5 - 5.1 mmol/L   Chloride 103 101 - 111 mmol/L   CO2 27 22 - 32 mmol/L   Glucose, Bld 109 (H) 65 - 99 mg/dL   BUN 11 6 - 20 mg/dL   Creatinine, Ser 1.94 0.61 - 1.24 mg/dL   Calcium 9.6 8.9 - 17.4 mg/dL   Total Protein 8.5 (H) 6.5 - 8.1 g/dL   Albumin 5.2 (H) 3.5 - 5.0 g/dL   AST 19 15 - 41 U/L   ALT 19 17 - 63 U/L   Alkaline Phosphatase 50 38 - 126 U/L   Total Bilirubin 0.6 0.3 - 1.2 mg/dL   GFR calc non Af Amer >60  >60 mL/min   GFR calc Af Amer >60 >60 mL/min   Anion gap 8 5 - 15  Lipase, blood  Result Value Ref Range   Lipase 15 (L) 22 - 51 U/L  CBC with Differential  Result Value Ref Range   WBC 7.3 4.0 - 10.5 K/uL   RBC 5.16 4.22 - 5.81 MIL/uL   Hemoglobin 16.2 13.0 - 17.0 g/dL   HCT 08.1 44.8 - 18.5 %   MCV 87.6 78.0 - 100.0 fL   MCH 31.4 26.0 - 34.0 pg   MCHC 35.8 30.0 - 36.0 g/dL   RDW 63.1 49.7 - 02.6 %   Platelets 313 150 - 400 K/uL   Neutrophils Relative % 59 43 - 77 %   Neutro Abs 4.4 1.7 - 7.7 K/uL   Lymphocytes Relative 31 12 - 46 %   Lymphs Abs 2.3 0.7 - 4.0 K/uL   Monocytes Relative 7 3 - 12 %   Monocytes Absolute 0.5 0.1 - 1.0 K/uL   Eosinophils Relative 2 0 - 5 %   Eosinophils Absolute 0.2 0.0 - 0.7 K/uL   Basophils Relative 1 0 - 1 %   Basophils Absolute 0.0 0.0 - 0.1 K/uL      Assessment & Plan:   1. Elevated blood pressure reading Monitor, readings are improved  2. Gastroesophageal reflux disease with esophagitis - pantoprazole (PROTONIX) 20 MG tablet; Take 1 tablet (20 mg total) by mouth daily.  Dispense: 30 tablet; Refill: 11   Continue all other maintenance medications as listed above.  Follow up plan: Return in about 4 weeks (around 02/20/2019).  Educational handout given for survey  Remus Loffler PA-C [facility] [address]  [place], [place] [address] [phone]   01/24/2019, 12:44 PM

## 2019-02-21 ENCOUNTER — Ambulatory Visit (INDEPENDENT_AMBULATORY_CARE_PROVIDER_SITE_OTHER): Payer: 59 | Admitting: Physician Assistant

## 2019-02-21 ENCOUNTER — Encounter: Payer: Self-pay | Admitting: Physician Assistant

## 2019-02-21 VITALS — BP 122/76 | HR 68 | Temp 96.8°F | Ht 68.0 in | Wt 148.2 lb

## 2019-02-21 DIAGNOSIS — K2100 Gastro-esophageal reflux disease with esophagitis, without bleeding: Secondary | ICD-10-CM

## 2019-02-23 NOTE — Progress Notes (Signed)
BP 122/76   Pulse 68   Temp (!) 96.8 F (36 C) (Oral)   Ht 5\' 8"  (1.727 m)   Wt 148 lb 3.2 oz (67.2 kg)   BMI 22.53 kg/m    Subjective:    Patient ID: Sean Gilbert, male    DOB: 1996/01/03, 23 y.o.   MRN: [id number]  HPI: BREYLIN CARLYON is a 23 y.o. male presenting on 02/21/2019 for 4 week rck blood pressure  After changing the omeprazole to pantoprazole his blood pressure has returned to normal. He has also had good readings at other appointments. He has had good control of the GERD also.  History reviewed. No pertinent past medical history. Relevant past medical, surgical, family and social history reviewed and updated as indicated. Interim medical history since our last visit reviewed. Allergies and medications reviewed and updated. DATA REVIEWED: CHART IN EPIC  Family History reviewed for pertinent findings.  Review of Systems  Constitutional: Negative.  Negative for appetite change and fatigue.  Eyes: Negative for pain and visual disturbance.  Respiratory: Negative.  Negative for cough, chest tightness, shortness of breath and wheezing.   Cardiovascular: Negative.  Negative for chest pain, palpitations and leg swelling.  Gastrointestinal: Negative.  Negative for abdominal pain, diarrhea, nausea and vomiting.  Genitourinary: Negative.   Skin: Negative.  Negative for color change and rash.  Neurological: Negative.  Negative for weakness, numbness and headaches.  Psychiatric/Behavioral: Negative.     Allergies as of 02/21/2019   No Known Allergies     Medication List       Accurate as of February 21, 2019 11:59 PM. Always use your most recent med list.        cetirizine 10 MG tablet Commonly known as:  ZYRTEC Take 1 tablet (10 mg total) by mouth daily.   fluticasone 50 MCG/ACT nasal spray Commonly known as:  FLONASE Place 2 sprays into both nostrils daily.   pantoprazole 20 MG tablet Commonly known as:  PROTONIX Take 1 tablet (20 mg total) by  mouth daily.   tizanidine 2 MG capsule Commonly known as:  Zanaflex Take 1 capsule (2 mg total) by mouth 3 (three) times daily.          Objective:    BP 122/76   Pulse 68   Temp (!) 96.8 F (36 C) (Oral)   Ht 5\' 8"  (1.727 m)   Wt 148 lb 3.2 oz (67.2 kg)   BMI 22.53 kg/m   No Known Allergies  Wt Readings from Last 3 Encounters:  02/21/19 148 lb 3.2 oz (67.2 kg)  01/23/19 150 lb 6.4 oz (68.2 kg)  12/31/18 150 lb (68 kg)    Physical Exam Vitals signs and nursing note reviewed.  Constitutional:      General: He is not in acute distress.    Appearance: He is well-developed.  HENT:     Head: Normocephalic and atraumatic.  Eyes:     Conjunctiva/sclera: Conjunctivae normal.     Pupils: Pupils are equal, round, and reactive to light.  Cardiovascular:     Rate and Rhythm: Normal rate and regular rhythm.     Heart sounds: Normal heart sounds.  Pulmonary:     Effort: Pulmonary effort is normal. No respiratory distress.     Breath sounds: Normal breath sounds.  Skin:    General: Skin is warm and dry.  Psychiatric:        Behavior: Behavior normal.         Assessment &  Plan:   1. Gastroesophageal reflux disease with esophagitis Continue pantoprazole 20 mg one daily Call if no better  Continue all other maintenance medications as listed above.  Follow up plan: No follow-ups on file.  Educational handout given for survey  Remus Loffler PA-C [facility] [address]  [place], [place] [address] [phone]   02/23/2019, 10:56 PM

## 2019-02-26 ENCOUNTER — Encounter: Payer: Self-pay | Admitting: Physician Assistant

## 2019-02-26 ENCOUNTER — Other Ambulatory Visit: Payer: Self-pay

## 2019-02-26 ENCOUNTER — Ambulatory Visit: Payer: 59 | Admitting: Physician Assistant

## 2019-02-26 ENCOUNTER — Ambulatory Visit (INDEPENDENT_AMBULATORY_CARE_PROVIDER_SITE_OTHER): Payer: 59 | Admitting: Physician Assistant

## 2019-02-26 VITALS — BP 121/74 | HR 74 | Temp 98.6°F | Ht 68.0 in | Wt 146.8 lb

## 2019-02-26 DIAGNOSIS — A09 Infectious gastroenteritis and colitis, unspecified: Secondary | ICD-10-CM

## 2019-02-26 DIAGNOSIS — F410 Panic disorder [episodic paroxysmal anxiety] without agoraphobia: Secondary | ICD-10-CM

## 2019-02-26 MED ORDER — ONDANSETRON 8 MG PO TBDP: 8.0000 mg | ORAL_TABLET | Freq: Three times a day (TID) | ORAL | 0 refills | Status: DC | PRN

## 2019-02-26 MED ORDER — HYDROXYZINE HCL 25 MG PO TABS: 25.0000 mg | ORAL_TABLET | Freq: Three times a day (TID) | ORAL | 0 refills | Status: DC | PRN

## 2019-02-26 NOTE — Progress Notes (Signed)
BP 121/74   Pulse 74   Temp 98.6 F (37 C) (Oral)   Ht  (1.727 m)   Wt 146 lb 12.8 oz (66.6 kg)   BMI 22.32 kg/m    Subjective:    Patient ID: Sean Gilbert, male    DOB: 26-Sep-1996, 23 y.o.   MRN: [id number]  HPI: Sean Gilbert is a 23 y.o. male presenting on 02/26/2019 for Anorexia (x 3 days); Emesis; and Nausea  This patient comes in with 4 day history of nausea and vomiting.  In the beginning nausea and vomiting were the only symptoms and halfway through more diarrhea.  Last time the patient ate was yesterday Positive exposure to others with gastroenteritis Denies fever. Denies blood.   History reviewed. No pertinent past medical history. Relevant past medical, surgical, family and social history reviewed and updated as indicated. Interim medical history since our last visit reviewed. Allergies and medications reviewed and updated. DATA REVIEWED: CHART IN EPIC  Family History reviewed for pertinent findings.  Review of Systems  Constitutional: Positive for fatigue and fever. Negative for appetite change.  Eyes: Negative for pain and visual disturbance.  Respiratory: Negative.  Negative for cough, chest tightness, shortness of breath and wheezing.   Cardiovascular: Negative.  Negative for chest pain, palpitations and leg swelling.  Gastrointestinal: Positive for abdominal pain, diarrhea, nausea and vomiting.  Genitourinary: Negative.  Negative for hematuria.  Musculoskeletal: Positive for myalgias.  Skin: Negative.  Negative for color change and rash.  Neurological: Negative.  Negative for weakness, numbness and headaches.  Psychiatric/Behavioral: Negative.     Allergies as of 02/26/2019   No Known Allergies     Medication List       Accurate as of February 26, 2019 11:59 PM. Always use your most recent med list.        cetirizine 10 MG tablet Commonly known as:  ZYRTEC Take 1 tablet (10 mg total) by mouth daily.   fluticasone 50 MCG/ACT  nasal spray Commonly known as:  FLONASE Place 2 sprays into both nostrils daily.   hydrOXYzine 25 MG tablet Commonly known as:  ATARAX/VISTARIL Take 1 tablet (25 mg total) by mouth 3 (three) times daily as needed.   ondansetron 8 MG disintegrating tablet Commonly known as:  Zofran ODT Take 1 tablet (8 mg total) by mouth every 8 (eight) hours as needed for nausea or vomiting.   pantoprazole 20 MG tablet Commonly known as:  PROTONIX Take 1 tablet (20 mg total) by mouth daily.   tizanidine 2 MG capsule Commonly known as:  Zanaflex Take 1 capsule (2 mg total) by mouth 3 (three) times daily.          Objective:    BP 121/74   Pulse 74   Temp 98.6 F (37 C) (Oral)   Ht  (1.727 m)   Wt 146 lb 12.8 oz (66.6 kg)   BMI 22.32 kg/m   No Known Allergies  Wt Readings from Last 3 Encounters:  02/26/19 146 lb 12.8 oz (66.6 kg)  02/21/19 148 lb 3.2 oz (67.2 kg)  01/23/19 150 lb 6.4 oz (68.2 kg)    Physical Exam Vitals signs and nursing note reviewed.  Constitutional:      General: He is in acute distress.     Appearance: He is well-developed. He is not diaphoretic.  HENT:     Head: Normocephalic and atraumatic.  Eyes:     Conjunctiva/sclera: Conjunctivae normal.     Pupils: Pupils  are equal, round, and reactive to light.  Cardiovascular:     Rate and Rhythm: Normal rate and regular rhythm.     Heart sounds: Normal heart sounds.  Pulmonary:     Effort: Pulmonary effort is normal. No respiratory distress.     Breath sounds: Normal breath sounds.  Abdominal:     General: Bowel sounds are increased.     Tenderness: There is generalized abdominal tenderness. There is no guarding or rebound.  Skin:    General: Skin is warm and dry.  Psychiatric:        Behavior: Behavior normal.     Results for orders placed or performed during the hospital encounter of 08/18/15  Comprehensive metabolic panel  Result Value Ref Range   Sodium 138 135 - 145 mmol/L   Potassium 3.3  (L) 3.5 - 5.1 mmol/L   Chloride 103 101 - 111 mmol/L   CO2 27 22 - 32 mmol/L   Glucose, Bld 109 (H) 65 - 99 mg/dL   BUN 11 6 - 20 mg/dL   Creatinine, Ser 5.74 0.61 - 1.24 mg/dL   Calcium 9.6 8.9 - 73.4 mg/dL   Total Protein 8.5 (H) 6.5 - 8.1 g/dL   Albumin 5.2 (H) 3.5 - 5.0 g/dL   AST 19 15 - 41 U/L   ALT 19 17 - 63 U/L   Alkaline Phosphatase 50 38 - 126 U/L   Total Bilirubin 0.6 0.3 - 1.2 mg/dL   GFR calc non Af Amer >60 >60 mL/min   GFR calc Af Amer >60 >60 mL/min   Anion gap 8 5 - 15  Lipase, blood  Result Value Ref Range   Lipase 15 (L) 22 - 51 U/L  CBC with Differential  Result Value Ref Range   WBC 7.3 4.0 - 10.5 K/uL   RBC 5.16 4.22 - 5.81 MIL/uL   Hemoglobin 16.2 13.0 - 17.0 g/dL   HCT 03.7 09.6 - 43.8 %   MCV 87.6 78.0 - 100.0 fL   MCH 31.4 26.0 - 34.0 pg   MCHC 35.8 30.0 - 36.0 g/dL   RDW 38.1 84.0 - 37.5 %   Platelets 313 150 - 400 K/uL   Neutrophils Relative % 59 43 - 77 %   Neutro Abs 4.4 1.7 - 7.7 K/uL   Lymphocytes Relative 31 12 - 46 %   Lymphs Abs 2.3 0.7 - 4.0 K/uL   Monocytes Relative 7 3 - 12 %   Monocytes Absolute 0.5 0.1 - 1.0 K/uL   Eosinophils Relative 2 0 - 5 %   Eosinophils Absolute 0.2 0.0 - 0.7 K/uL   Basophils Relative 1 0 - 1 %   Basophils Absolute 0.0 0.0 - 0.1 K/uL      Assessment & Plan:   1. Gastroenteritis, infectious - ondansetron (ZOFRAN ODT) 8 MG disintegrating tablet; Take 1 tablet (8 mg total) by mouth every 8 (eight) hours as needed for nausea or vomiting.  Dispense: 20 tablet; Refill: 0  2. Diarrhea of infectious origin - ondansetron (ZOFRAN ODT) 8 MG disintegrating tablet; Take 1 tablet (8 mg total) by mouth every 8 (eight) hours as needed for nausea or vomiting.  Dispense: 20 tablet; Refill: 0  3. Panic attack - hydrOXYzine (ATARAX/VISTARIL) 25 MG tablet; Take 1 tablet (25 mg total) by mouth 3 (three) times daily as needed.  Dispense: 30 tablet; Refill: 0   Continue all other maintenance medications as listed above.   Follow up plan: No follow-ups on file.  Educational  handout given for survey  Remus Loffler PA-C [facility] [address]  [place], [place] [address] [phone]   02/27/2019, 10:19 PM

## 2019-02-26 NOTE — Patient Instructions (Signed)
Bland Diet  A bland diet consists of foods that are often soft and do not have a lot of fat, fiber, or extra seasonings. Foods without fat, fiber, or seasoning are easier for the body to digest. They are also less likely to irritate your mouth, throat, stomach, and other parts of your digestive system. A bland diet is sometimes called a BRAT diet.  What is my plan?  Your health care provider or food and nutrition specialist (dietitian) may recommend specific changes to your diet to prevent symptoms or to treat your symptoms. These changes may include:   Eating small meals often.   Cooking food until it is soft enough to chew easily.   Chewing your food well.   Drinking fluids slowly.   Not eating foods that are very spicy, sour, or fatty.   Not eating citrus fruits, such as oranges and grapefruit.  What do I need to know about this diet?   Eat a variety of foods from the bland diet food list.   Do not follow a bland diet longer than needed.   Ask your health care provider whether you should take vitamins or supplements.  What foods can I eat?  Grains    Hot cereals, such as cream of wheat. Rice. Bread, crackers, or tortillas made from refined white flour.  Vegetables  Canned or cooked vegetables. Mashed or boiled potatoes.  Fruits    Bananas. Applesauce. Other types of cooked or canned fruit with the skin and seeds removed, such as canned peaches or pears.  Meats and other proteins    Scrambled eggs. Creamy peanut butter or other nut butters. Lean, well-cooked meats, such as chicken or fish. Tofu. Soups or broths.  Dairy  Low-fat dairy products, such as milk, cottage cheese, or yogurt.  Beverages    Water. Herbal tea. Apple juice.  Fats and oils  Mild salad dressings. Canola or olive oil.  Sweets and desserts  Pudding. Custard. Fruit gelatin. Ice cream.  The items listed above may not be a complete list of recommended foods and beverages. Contact a dietitian for more options.  What foods are not  recommended?  Grains  Whole grain breads and cereals.  Vegetables  Raw vegetables.  Fruits  Raw fruits, especially citrus, berries, or dried fruits.  Dairy  Whole fat dairy foods.  Beverages  Caffeinated drinks. Alcohol.  Seasonings and condiments  Strongly flavored seasonings or condiments. Hot sauce. Salsa.  Other foods  Spicy foods. Fried foods. Sour foods, such as pickled or fermented foods. Foods with high sugar content. Foods high in fiber.  The items listed above may not be a complete list of foods and beverages to avoid. Contact a dietitian for more information.  Summary   A bland diet consists of foods that are often soft and do not have a lot of fat, fiber, or extra seasonings.   Foods without fat, fiber, or seasoning are easier for the body to digest.   Check with your health care provider to see how long you should follow this diet plan. It is not meant to be followed for long periods.  This information is not intended to replace advice given to you by your health care provider. Make sure you discuss any questions you have with your health care provider.  Document Released: 03/27/2016 Document Revised: 01/02/2018 Document Reviewed: 01/02/2018  Elsevier Interactive Patient Education  2019 [employer] Inc[employer]

## 2019-02-27 ENCOUNTER — Other Ambulatory Visit: Payer: Self-pay | Admitting: *Deleted

## 2019-02-27 ENCOUNTER — Telehealth: Payer: Self-pay | Admitting: Physician Assistant

## 2019-02-27 NOTE — Telephone Encounter (Signed)
Patient has gastroenteritis.

## 2019-02-27 NOTE — Telephone Encounter (Signed)
Please advise on extended note.

## 2019-02-27 NOTE — Telephone Encounter (Signed)
Pt can extend note

## 2019-02-27 NOTE — Telephone Encounter (Signed)
Aware. 

## 2019-02-27 NOTE — Telephone Encounter (Signed)
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

## 2019-03-19 ENCOUNTER — Telehealth: Payer: Self-pay | Admitting: Physician Assistant

## 2019-03-19 ENCOUNTER — Encounter: Payer: Self-pay | Admitting: Physician Assistant

## 2019-03-19 ENCOUNTER — Other Ambulatory Visit: Payer: Self-pay

## 2019-03-19 ENCOUNTER — Ambulatory Visit (INDEPENDENT_AMBULATORY_CARE_PROVIDER_SITE_OTHER): Payer: 59 | Admitting: Physician Assistant

## 2019-03-19 DIAGNOSIS — R05 Cough: Secondary | ICD-10-CM

## 2019-03-19 DIAGNOSIS — J4520 Mild intermittent asthma, uncomplicated: Secondary | ICD-10-CM | POA: Insufficient documentation

## 2019-03-19 DIAGNOSIS — Z20828 Contact with and (suspected) exposure to other viral communicable diseases: Secondary | ICD-10-CM

## 2019-03-19 DIAGNOSIS — R059 Cough, unspecified: Secondary | ICD-10-CM

## 2019-03-19 DIAGNOSIS — Z20822 Contact with and (suspected) exposure to covid-19: Secondary | ICD-10-CM

## 2019-03-19 NOTE — Progress Notes (Signed)
   Telephone visit  Subjective: Sean Gilbert, possible exposure to COVID 19 PCP: Remus Loffler, PA-C [id number] Sean Gilbert is a 23 y.o. male calls for telephone consult today. Patient provides verbal consent for consult held via phone.  Patient is identified with 2 separate identifiers.  At this time the entire area is on COVID-19 social distancing and stay home orders are in place.  Patient is of higher risk and therefore we are performing this by a virtual method.  Location of patient: work Location of provider: [facility] Others present for call: none  This patient has a known history of asthma.  As a child he would have severe bronchitis when he did get a respiratory infection.  That is why he was concerned about any exposure to COVID-19.  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  We have discussed that I can take him out for couple weeks if he feels that he has been in close contact with those people.  He works in a [employer] and people touch all the surfaces as they are pulling orders.  I have also discussed with him that if he was out for 2 weeks and then return to work he would probably still be in a very high rate of infection in the community.  And I am not sure what his work will allow him to do as far as missing this much.  He will talk with his manager and get back with me.   ROS: Per HPI  No Known Allergies No past medical history on file.  Current Outpatient Medications:  .  cetirizine (ZYRTEC) 10 MG tablet, Take 1 tablet (10 mg total) by mouth daily., Disp: 30 tablet, Rfl: 11 .  fluticasone (FLONASE) 50 MCG/ACT nasal spray, Place 2 sprays into both nostrils daily., Disp: 16 g, Rfl: 6 .  hydrOXYzine (ATARAX/VISTARIL) 25 MG tablet, Take 1 tablet (25 mg total) by mouth 3 (three) times daily as needed., Disp: 30 tablet, Rfl: 0 .  ondansetron (ZOFRAN ODT) 8 MG disintegrating tablet, Take 1 tablet (8  mg total) by mouth every 8 (eight) hours as needed for nausea or vomiting., Disp: 20 tablet, Rfl: 0 .  pantoprazole (PROTONIX) 20 MG tablet, Take 1 tablet (20 mg total) by mouth daily., Disp: 30 tablet, Rfl: 11 .  tizanidine (ZANAFLEX) 2 MG capsule, Take 1 capsule (2 mg total) by mouth 3 (three) times daily., Disp: 30 capsule, Rfl: 1  Assessment/ Plan: 23 y.o. male   1. Cough Order of care Delsym  2. Mild intermittent asthma, unspecified whether complicated Continue medications if needed  3. Exposure to 2019 Novel Coronavirus Possible work note if needed   To Whom it May Concern:  Sean Gilbert was seen in my clinic on 03/19/2019. He has increased respiratory risk due to asthma and will be out of work April 3 through April 16.  Note if needed.  Start time: 10:15 AM End time: 10:22 AM   No orders of the defined types were placed in this encounter.   Prudy Feeler PA-C [facility] [phone]

## 2019-04-28 ENCOUNTER — Ambulatory Visit: Payer: 59 | Admitting: Physician Assistant

## 2019-04-28 NOTE — Progress Notes (Deleted)
10:41 AM no answer Left message to call office, will try back a few times later  11:03 AM no answer

## 2019-04-30 ENCOUNTER — Other Ambulatory Visit: Payer: Self-pay | Admitting: *Deleted

## 2019-04-30 ENCOUNTER — Other Ambulatory Visit: Payer: Self-pay

## 2019-04-30 ENCOUNTER — Encounter: Payer: Self-pay | Admitting: Family Medicine

## 2019-04-30 ENCOUNTER — Ambulatory Visit (INDEPENDENT_AMBULATORY_CARE_PROVIDER_SITE_OTHER): Payer: 59 | Admitting: Family Medicine

## 2019-04-30 ENCOUNTER — Telehealth: Payer: Self-pay | Admitting: Physician Assistant

## 2019-04-30 DIAGNOSIS — N410 Acute prostatitis: Secondary | ICD-10-CM | POA: Diagnosis not present

## 2019-04-30 MED ORDER — DOXYCYCLINE HYCLATE 100 MG PO CAPS: 100.0000 mg | ORAL_CAPSULE | Freq: Two times a day (BID) | ORAL | 0 refills | Status: DC

## 2019-04-30 NOTE — Progress Notes (Signed)
    Subjective:    Patient ID: Sean Gilbert, male    DOB: Dec 14, 1996, 23 y.o.   MRN: [id number]   HPI: Sean Gilbert is a 23 y.o. male presenting for burning with urination and frequency every 15 min. No nausea, vomiting.Also pain in lower back and in his sides. 3-4/10 "Discomfort" without sharpness or cramp." No dysuria. Denies gross hematuria. Denies fever, chills, sweats. Onset of frequency 2 days ago. Pain onset last night.     Depression screen [facility] 2/9 02/21/2019 01/23/2019 12/31/2018 12/25/2018 12/16/2018  Decreased Interest 0 0 0 0 0  Down, Depressed, Hopeless 0 0 0 0 0  PHQ - 2 Score 0 0 0 0 0     Relevant past medical, surgical, family and social history reviewed and updated as indicated.  Interim medical history since our last visit reviewed. Allergies and medications reviewed and updated.  ROS:  Review of Systems  Constitutional: Negative for chills, fatigue and fever.  HENT: Negative for sore throat.   Cardiovascular: Negative for chest pain.  Gastrointestinal: Positive for abdominal pain. Negative for constipation, diarrhea, nausea and vomiting.  Genitourinary: Positive for frequency.  Musculoskeletal: Positive for back pain.  Skin: Negative for color change and rash.     Social History   Tobacco Use  Smoking Status Never Smoker  Smokeless Tobacco Never Used       Objective:     Wt Readings from Last 3 Encounters:  02/26/19 146 lb 12.8 oz (66.6 kg)  02/21/19 148 lb 3.2 oz (67.2 kg)  01/23/19 150 lb 6.4 oz (68.2 kg)     Exam deferred. Pt. Harboring due to COVID 19. Phone visit performed.   Assessment & Plan:   1. Acute prostatitis     Meds ordered this encounter  Medications  . doxycycline (VIBRAMYCIN) 100 MG capsule    Sig: Take 1 capsule (100 mg total) by mouth 2 (two) times daily.    Dispense:  20 capsule    Refill:  0    No orders of the defined types were placed in this encounter.     Diagnoses and all orders for this  visit:  Acute prostatitis  Other orders -     doxycycline (VIBRAMYCIN) 100 MG capsule; Take 1 capsule (100 mg total) by mouth 2 (two) times daily.    Virtual Visit via telephone Note  I discussed the limitations, risks, security and privacy concerns of performing an evaluation and management service by telephone and the availability of in person appointments. The patient was identified with two identifiers. Pt.expressed understanding and agreed to proceed. Pt. Is at home. Dr. Darlyn Read is in his office.  Follow Up Instructions:   I discussed the assessment and treatment plan with the patient. The patient was provided an opportunity to ask questions and all were answered. The patient agreed with the plan and demonstrated an understanding of the instructions.   The patient was advised to call back or seek an in-person evaluation if the symptoms worsen or if the condition fails to improve as anticipated.   Total minutes including chart review and phone contact time: 9   Follow up plan: Return if symptoms worsen or fail to improve.  Mechele Claude, MD [facility]

## 2019-04-30 NOTE — Telephone Encounter (Signed)
Aware. Excuse note is ready.

## 2019-05-01 ENCOUNTER — Telehealth: Payer: Self-pay | Admitting: Family Medicine

## 2019-05-01 ENCOUNTER — Encounter: Payer: Self-pay | Admitting: *Deleted

## 2019-05-01 NOTE — Telephone Encounter (Signed)
Extended note completed.  Patient aware

## 2019-05-01 NOTE — Telephone Encounter (Signed)
Okay to extend note as requested

## 2019-05-02 NOTE — Telephone Encounter (Signed)
Ok to give, but if continues will need to be reevaluated.

## 2019-05-02 NOTE — Telephone Encounter (Signed)
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

## 2019-05-02 NOTE — Telephone Encounter (Signed)
Note is ready for pick up and pt is aware.

## 2019-08-08 ENCOUNTER — Encounter: Payer: Self-pay | Admitting: Family Medicine

## 2019-08-08 ENCOUNTER — Other Ambulatory Visit: Payer: Self-pay

## 2019-08-08 ENCOUNTER — Ambulatory Visit (INDEPENDENT_AMBULATORY_CARE_PROVIDER_SITE_OTHER): Payer: 59 | Admitting: Family Medicine

## 2019-08-08 VITALS — BP 131/77 | HR 86 | Temp 99.1°F | Ht 68.0 in | Wt 146.0 lb

## 2019-08-08 DIAGNOSIS — I891 Lymphangitis: Secondary | ICD-10-CM

## 2019-08-08 MED ORDER — AMOXICILLIN-POT CLAVULANATE 875-125 MG PO TABS: 1.0000 | ORAL_TABLET | Freq: Two times a day (BID) | ORAL | 0 refills | Status: DC

## 2019-08-08 NOTE — Progress Notes (Signed)
No chief complaint on file.   HPI  Patient presents today for tender lump at right axilla  PMH: Smoking status noted ROS: Per HPI  Objective: BP 131/77   Pulse 86   Temp 99.1 F (37.3 C) (Temporal)   Ht 5\' 8"  (1.727 m)   Wt 146 lb (66.2 kg)   BMI 22.20 kg/m  Gen: NAD, alert, cooperative with exam HEENT: NCAT, EOMI, PERRL CV: RRR, good S1/S2, no murmur Resp: CTABL, no wheezes, non-labored Ext: No edema, warm. Tender palpable, mbile node at triceps origin near axilla on right Neuro: Alert and oriented, No gross deficits  Assessment and plan:  1. Lymphangitis     Meds ordered this encounter  Medications  . amoxicillin-clavulanate (AUGMENTIN) 875-125 MG tablet    Sig: Take 1 tablet by mouth 2 (two) times daily. Take all of this medication    Dispense:  20 tablet    Refill:  0      Follow up as needed.  Claretta Fraise, MD

## 2019-08-14 ENCOUNTER — Encounter: Payer: Self-pay | Admitting: Family Medicine

## 2019-09-04 ENCOUNTER — Other Ambulatory Visit: Payer: Self-pay

## 2019-09-04 ENCOUNTER — Encounter: Payer: Self-pay | Admitting: Family

## 2019-09-04 ENCOUNTER — Ambulatory Visit (INDEPENDENT_AMBULATORY_CARE_PROVIDER_SITE_OTHER): Payer: 59 | Admitting: Family

## 2019-09-04 VITALS — BP 124/84 | HR 74 | Temp 97.5°F | Resp 20 | Ht 68.0 in | Wt 147.6 lb

## 2019-09-04 DIAGNOSIS — Z0000 Encounter for general adult medical examination without abnormal findings: Secondary | ICD-10-CM

## 2019-09-04 DIAGNOSIS — I891 Lymphangitis: Secondary | ICD-10-CM | POA: Diagnosis not present

## 2019-09-04 LAB — CBC WITH DIFFERENTIAL/PLATELET
Basophils Absolute: 0.1 10*3/uL (ref 0.0–0.2)
Basos: 1 %
EOS (ABSOLUTE): 0.4 10*3/uL (ref 0.0–0.4)
Eos: 9 %
Hematocrit: 45.2 % (ref 37.5–51.0)
Hemoglobin: 15.7 g/dL (ref 13.0–17.7)
Immature Grans (Abs): 0 10*3/uL (ref 0.0–0.1)
Immature Granulocytes: 0 %
Lymphocytes Absolute: 1.8 10*3/uL (ref 0.7–3.1)
Lymphs: 43 %
MCH: 30.4 pg (ref 26.6–33.0)
MCHC: 34.7 g/dL (ref 31.5–35.7)
MCV: 88 fL (ref 79–97)
Monocytes Absolute: 0.5 10*3/uL (ref 0.1–0.9)
Monocytes: 11 %
Neutrophils Absolute: 1.5 10*3/uL (ref 1.4–7.0)
Neutrophils: 36 %
Platelets: 325 10*3/uL (ref 150–450)
RBC: 5.16 x10E6/uL (ref 4.14–5.80)
RDW: 12 % (ref 11.6–15.4)
WBC: 4.2 10*3/uL (ref 3.4–10.8)

## 2019-09-04 NOTE — Patient Instructions (Signed)

## 2019-09-04 NOTE — Progress Notes (Signed)
   Subjective:    Patient ID: Sean Gilbert, male    DOB: [birth year], 23 y.o.   MRN: [id number]  HPI PT presents to the office today with a swollen place that he noticed about two months under his right axilla. He was seen in the office on 08/08/19 and was given Augmentin 10 days. He completed that and states he might have noticed a slight decrease in size.   He denies any pain, redness, or discharge. He states he has not noticed any other swollen nodules.   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   Review of Systems  All other systems reviewed and are negative.      Objective:   Physical Exam Vitals signs reviewed.  Constitutional:      General: He is not in acute distress.    Appearance: He is well-developed.  HENT:     Head: Normocephalic.     Right Ear: Tympanic membrane normal.     Left Ear: Tympanic membrane normal.  Eyes:     General:        Right eye: No discharge.        Left eye: No discharge.     Pupils: Pupils are equal, round, and reactive to light.  Neck:     Musculoskeletal: Normal range of motion and neck supple.     Thyroid: No thyromegaly.  Cardiovascular:     Rate and Rhythm: Normal rate and regular rhythm.     Heart sounds: Normal heart sounds. No murmur.  Pulmonary:     Effort: Pulmonary effort is normal. No respiratory distress.     Breath sounds: Normal breath sounds. No wheezing.  Abdominal:     General: Bowel sounds are normal. There is no distension.     Palpations: Abdomen is soft.     Tenderness: There is no abdominal tenderness.  Musculoskeletal: Normal range of motion.        General: No tenderness.  Skin:    General: Skin is warm and dry.     Findings: No erythema or rash.     Comments: No swollen lymph node noted, pt's tendon can be felt when he has his shoulder abducted   Neurological:     Mental  Status: He is alert and oriented to person, place, and time.     Cranial Nerves: No cranial nerve deficit.     Deep Tendon Reflexes: Reflexes are normal and symmetric.  Psychiatric:        Behavior: Behavior normal.        Thought Content: Thought content normal.        Judgment: Judgment normal.       BP 124/84   Pulse 74   Temp (!) 97.5 F (36.4 C) (Temporal)   Resp 20   Ht 5\' 8"  (1.727 m)   Wt 147 lb 9.6 oz (67 kg)   SpO2 100%   BMI 22.44 kg/m      Assessment & Plan:  1. Normal shoulder exam I do not feel a lymph enlargement  Normal shoulder exam I believe this enlargement is related to patient being right hand dominant and working out He will continue to monitor and if he notices any more enlargement he will follow up - CBC with Differential/Platelet  Evelina Dun, FNP

## 2019-10-20 ENCOUNTER — Ambulatory Visit (INDEPENDENT_AMBULATORY_CARE_PROVIDER_SITE_OTHER): Payer: 59 | Admitting: Family Medicine

## 2019-10-20 ENCOUNTER — Telehealth: Payer: Self-pay | Admitting: Physician Assistant

## 2019-10-20 ENCOUNTER — Encounter: Payer: Self-pay | Admitting: Family Medicine

## 2019-10-20 DIAGNOSIS — J029 Acute pharyngitis, unspecified: Secondary | ICD-10-CM | POA: Diagnosis not present

## 2019-10-20 MED ORDER — AZITHROMYCIN 250 MG PO TABS
ORAL_TABLET | ORAL | 0 refills | Status: DC
Start: 2019-10-20 — End: 2020-01-21

## 2019-10-20 NOTE — Telephone Encounter (Signed)
Patient aware and letter faxed

## 2019-10-20 NOTE — Progress Notes (Signed)
**Note Sean-Identified via Obfuscation** Subjective:    Patient ID: Sean Gilbert, male    DOB: 08-Sep-1996, 23 y.o.   MRN: [id number]   HPI: Sean Gilbert is a 23 y.o. male presenting for fever. Onset 2 days ago . ST. Then high fever,  Sweats, vomiting. Dayquil controlling fever. Having HA. Sinus pressure. Rare cough.No energy. In bed last two days.    Depression screen [facility] 2/9 09/04/2019 08/08/2019 02/21/2019 01/23/2019 12/31/2018  Decreased Interest 0 0 0 0 0  Down, Depressed, Hopeless 0 0 0 0 0  PHQ - 2 Score 0 0 0 0 0  Altered sleeping - 0 - - -  Tired, decreased energy - 0 - - -  Change in appetite - 0 - - -  Feeling bad or failure about yourself  - 0 - - -  Trouble concentrating - 0 - - -  Moving slowly or fidgety/restless - 0 - - -  Suicidal thoughts - 0 - - -  PHQ-9 Score - 0 - - -     Relevant past medical, surgical, family and social history reviewed and updated as indicated.  Interim medical history since our last visit reviewed. Allergies and medications reviewed and updated.  ROS:  Review of Systems  Constitutional: Negative for activity change, appetite change, chills and fever.  HENT: Positive for congestion, postnasal drip, rhinorrhea and sore throat. Negative for ear discharge, ear pain, hearing loss, nosebleeds, sinus pressure, sneezing and trouble swallowing.   Respiratory: Negative for chest tightness and shortness of breath.   Cardiovascular: Negative for chest pain and palpitations.  Skin: Negative for rash.     Social History   Tobacco Use  Smoking Status Never Smoker  Smokeless Tobacco Never Used       Objective:     Wt Readings from Last 3 Encounters:  09/04/19 147 lb 9.6 oz (67 kg)  08/08/19 146 lb (66.2 kg)  02/26/19 146 lb 12.8 oz (66.6 kg)     Exam deferred. Pt. Harboring due to COVID 19. Phone visit performed.   Assessment & Plan:   1. Pharyngitis, unspecified etiology     Meds ordered this encounter  Medications  . azithromycin (ZITHROMAX Z-PAK) 250  MG tablet    Sig: Take two right away Then one a day for the next 4 days.    Dispense:  6 each    Refill:  0    Orders Placed This Encounter  Procedures  . Novel Coronavirus, NAA ([facility])    Order Specific Question:   Is this test for diagnosis or screening    Answer:   Diagnosis of ill patient    Order Specific Question:   Symptomatic for COVID-19 as defined by CDC    Answer:   Yes    Order Specific Question:   Date of Symptom Onset    Answer:   09/20/2019    Order Specific Question:   Hospitalized for COVID-19    Answer:   No    Order Specific Question:   Admitted to ICU for COVID-19    Answer:   No    Order Specific Question:   Previously tested for COVID-19    Answer:   No    Order Specific Question:   Resident in a congregate (group) care setting    Answer:   No    Order Specific Question:   Is the patient student?    Answer:   No    Order Specific Question:   Employed in healthcare  setting    Answer:   No      Diagnoses and all orders for this visit:  Pharyngitis, unspecified etiology -     Novel Coronavirus, NAA ([facility])  Other orders -     azithromycin (ZITHROMAX Z-PAK) 250 MG tablet; Take two right away Then one a day for the next 4 days.    Virtual Visit via telephone Note  I discussed the limitations, risks, security and privacy concerns of performing an evaluation and management service by telephone and the availability of in person appointments. The patient was identified with two identifiers. Pt.expressed understanding and agreed to proceed. Pt. Is at home. Dr. Darlyn Read is in his office.  Follow Up Instructions:   I discussed the assessment and treatment plan with the patient. The patient was provided an opportunity to ask questions and all were answered. The patient agreed with the plan and demonstrated an understanding of the instructions.   The patient was advised to call back or seek an in-person evaluation if the symptoms worsen or if the condition  fails to improve as anticipated.   Total minutes including chart review and phone contact time: 8   Follow up plan: No follow-ups on file.  Mechele Claude, MD [facility]

## 2019-10-21 ENCOUNTER — Other Ambulatory Visit: Payer: Self-pay

## 2019-10-21 DIAGNOSIS — Z20822 Contact with and (suspected) exposure to covid-19: Secondary | ICD-10-CM

## 2019-10-22 LAB — NOVEL CORONAVIRUS, NAA: SARS-CoV-2, NAA: DETECTED — AB

## 2019-10-23 ENCOUNTER — Other Ambulatory Visit: Payer: Self-pay | Admitting: Physician Assistant

## 2019-10-23 ENCOUNTER — Telehealth: Payer: Self-pay | Admitting: Physician Assistant

## 2019-10-23 NOTE — Telephone Encounter (Signed)
Letter written and on MyChart

## 2019-10-23 NOTE — Telephone Encounter (Signed)
Patient tested positive for COVId needs note for work. Please advise.

## 2019-10-24 NOTE — Telephone Encounter (Signed)
Detailed message left for patient that letter has been sent to his mychart.

## 2020-01-21 ENCOUNTER — Ambulatory Visit (INDEPENDENT_AMBULATORY_CARE_PROVIDER_SITE_OTHER): Payer: 59 | Admitting: Physician Assistant

## 2020-01-21 ENCOUNTER — Telehealth: Payer: Self-pay | Admitting: Physician Assistant

## 2020-01-21 ENCOUNTER — Encounter: Payer: Self-pay | Admitting: Family Medicine

## 2020-01-21 DIAGNOSIS — R059 Cough, unspecified: Secondary | ICD-10-CM

## 2020-01-21 DIAGNOSIS — J301 Allergic rhinitis due to pollen: Secondary | ICD-10-CM | POA: Diagnosis not present

## 2020-01-21 DIAGNOSIS — R058 Other specified cough: Secondary | ICD-10-CM

## 2020-01-21 DIAGNOSIS — R05 Cough: Secondary | ICD-10-CM | POA: Diagnosis not present

## 2020-01-21 MED ORDER — AZITHROMYCIN 250 MG PO TABS: ORAL_TABLET | ORAL | 0 refills | Status: DC

## 2020-01-21 MED ORDER — FLUTICASONE PROPIONATE 50 MCG/ACT NA SUSP: 2.0000 | Freq: Every day | NASAL | 6 refills | Status: DC

## 2020-01-21 NOTE — Progress Notes (Signed)
     Telephone visit  Subjective: Sean Gilbert, sinus PCP: Remus Loffler, PA-C JPV:[id number] Sean Gilbert is a 24 y.o. male calls for telephone consult today. Patient provides verbal consent for consult held via phone.  Patient is identified with 2 separate identifiers.  At this time the entire area is on COVID-19 social distancing and stay home orders are in place.  Patient is of higher risk and therefore we are performing this by a virtual method.  Location of patient: home Location of provider: HOME Others present for call: no  This patient has had many days of sore throat and postnasal drainage, headache at times and sinus pressure. There is copious drainage at times. Denies any fever at this time. There has been a history of sinus infections in the past.  There is cough at night. It has become more prevalent in recent days. Patient with several days of progressing upper respiratory and bronchial symptoms. Initially there was more upper respiratory congestion. This progressed to having significant cough that is productive throughout the day and severe at night. There is occasional wheezing after coughing. Sometimes there is slight dyspnea on exertion. It is productive mucus that is yellow in color. Denies any blood.    ROS: Per HPI  No Known Allergies Past Medical History:  Diagnosis Date  . Allergy     Current Outpatient Medications:  .  azithromycin (ZITHROMAX Z-PAK) 250 MG tablet, Take two right away Then one a day for the next 4 days., Disp: 6 each, Rfl: 0 .  cetirizine (ZYRTEC) 10 MG tablet, Take 1 tablet (10 mg total) by mouth daily., Disp: 30 tablet, Rfl: 11 .  fluticasone (FLONASE) 50 MCG/ACT nasal spray, Place 2 sprays into both nostrils daily., Disp: 16 g, Rfl: 6 .  pantoprazole (PROTONIX) 20 MG tablet, Take 1 tablet (20 mg total) by mouth daily., Disp: 30 tablet, Rfl: 11  Assessment/ Plan: 24 y.o. male   1. Cough - azithromycin (ZITHROMAX Z-PAK) 250 MG tablet; Take  two right away Then one a day for the next 4 days.  Dispense: 6 each; Refill: 0  2. Productive cough - azithromycin (ZITHROMAX Z-PAK) 250 MG tablet; Take two right away Then one a day for the next 4 days.  Dispense: 6 each; Refill: 0  3. Non-seasonal allergic rhinitis due to pollen - fluticasone (FLONASE) 50 MCG/ACT nasal spray; Place 2 sprays into both nostrils daily.  Dispense: 16 g; Refill: 6   No follow-ups on file.  Continue all other maintenance medications as listed above.  Start time: 1:17 PM End time: 1:27 PM  Meds ordered this encounter  Medications  . azithromycin (ZITHROMAX Z-PAK) 250 MG tablet    Sig: Take two right away Then one a day for the next 4 days.    Dispense:  6 each    Refill:  0    Order Specific Question:   Supervising Provider    Answer:   Raliegh Ip [[id number]]  . fluticasone (FLONASE) 50 MCG/ACT nasal spray    Sig: Place 2 sprays into both nostrils daily.    Dispense:  16 g    Refill:  6    Order Specific Question:   Supervising Provider    Answer:   Raliegh Ip [[id number]]    Prudy Feeler PA-C [facility] [phone]

## 2020-01-21 NOTE — Telephone Encounter (Signed)
Patient aware note sent to mychart  

## 2020-01-25 ENCOUNTER — Encounter: Payer: Self-pay | Admitting: Physician Assistant

## 2020-03-04 ENCOUNTER — Other Ambulatory Visit: Payer: Self-pay | Admitting: Physician Assistant

## 2020-03-04 DIAGNOSIS — K2100 Gastro-esophageal reflux disease with esophagitis, without bleeding: Secondary | ICD-10-CM

## 2020-03-05 ENCOUNTER — Ambulatory Visit (INDEPENDENT_AMBULATORY_CARE_PROVIDER_SITE_OTHER): Payer: 59 | Admitting: Nurse Practitioner

## 2020-03-05 ENCOUNTER — Other Ambulatory Visit: Payer: Self-pay

## 2020-03-05 ENCOUNTER — Encounter: Payer: Self-pay | Admitting: Nurse Practitioner

## 2020-03-05 VITALS — BP 124/82 | HR 96 | Temp 98.9°F | Resp 20 | Ht 68.0 in | Wt 150.0 lb

## 2020-03-05 DIAGNOSIS — R2231 Localized swelling, mass and lump, right upper limb: Secondary | ICD-10-CM

## 2020-03-05 NOTE — Progress Notes (Signed)
   Subjective:    Chief Complaint: Lump under right arm (Has been seen twice for it)   HPI Has lump under right axilla. Has been seen twice for this issue before. First visit in August 2020 he was prescribed antibiotics but that didn't help. Returned in September 2020 and had lab work ordered but everything was normal. Pt first noticed lump a couple of weeks before his first visit. Lump has not changed in size. Achy pain rated 4/10 that occurs occasionally. Denies any other symptoms.      Review of Systems  Constitutional: Positive for fatigue.  Respiratory: Negative.   Cardiovascular: Negative.   Gastrointestinal: Negative.   Neurological: Negative.   Psychiatric/Behavioral: Negative.   All other systems reviewed and are negative.      Objective:   Physical Exam Vitals and nursing note reviewed.  Constitutional:      Appearance: He is normal weight.  HENT:     Head: Normocephalic.  Cardiovascular:     Rate and Rhythm: Normal rate and regular rhythm.     Heart sounds: Normal heart sounds.  Pulmonary:     Breath sounds: Normal breath sounds.  Abdominal:     General: Abdomen is flat. Bowel sounds are normal.  Musculoskeletal:        General: Normal range of motion.  Skin:    General: Skin is warm and dry.     Comments: Questionable right nontender axillary mass.  Neurological:     Mental Status: He is alert and oriented to person, place, and time.           Assessment & Plan:  LLOYDE LUDLAM comes in today with chief complaint of Lump under right arm (Has been seen twice for it)   Diagnosis and orders addressed:  1. Axillary mass, right Watch area for any changes. - [place] AXILLA RIGHT; Future   Labs pending Health Maintenance reviewed Diet and exercise encouraged  Follow up plan: prn   Mary-Margaret Daphine Deutscher, FNP

## 2020-03-08 ENCOUNTER — Telehealth: Payer: Self-pay | Admitting: Physician Assistant

## 2020-03-10 ENCOUNTER — Telehealth: Payer: Self-pay | Admitting: Physician Assistant

## 2020-03-12 ENCOUNTER — Ambulatory Visit (HOSPITAL_COMMUNITY): Admission: RE | Admit: 2020-03-12 | Payer: Managed Care, Other (non HMO) | Source: Ambulatory Visit

## 2020-03-17 ENCOUNTER — Other Ambulatory Visit: Payer: Self-pay

## 2020-03-17 ENCOUNTER — Ambulatory Visit (HOSPITAL_COMMUNITY)
Admission: RE | Admit: 2020-03-17 | Discharge: 2020-03-17 | Disposition: A | Discharge status: Home / Self Care | Payer: Managed Care, Other (non HMO) | Source: Ambulatory Visit | Attending: Nurse Practitioner | Admitting: Nurse Practitioner

## 2020-03-17 DIAGNOSIS — R2231 Localized swelling, mass and lump, right upper limb: Secondary | ICD-10-CM | POA: Diagnosis present

## 2020-04-26 ENCOUNTER — Ambulatory Visit (INDEPENDENT_AMBULATORY_CARE_PROVIDER_SITE_OTHER): Payer: 59 | Admitting: Nurse Practitioner

## 2020-04-26 ENCOUNTER — Encounter: Payer: Self-pay | Admitting: Nurse Practitioner

## 2020-04-26 DIAGNOSIS — K2100 Gastro-esophageal reflux disease with esophagitis, without bleeding: Secondary | ICD-10-CM | POA: Diagnosis not present

## 2020-04-26 MED ORDER — PANTOPRAZOLE SODIUM 20 MG PO TBEC: 20.0000 mg | DELAYED_RELEASE_TABLET | Freq: Every day | ORAL | 3 refills | Status: DC

## 2020-04-26 NOTE — Progress Notes (Signed)
   Virtual Visit via telephone Note Due to COVID-19 pandemic this visit was conducted virtually. This visit type was conducted due to national recommendations for restrictions regarding the COVID-19 Pandemic (e.g. social distancing, sheltering in place) in an effort to limit this patient's exposure and mitigate transmission in our community. All issues noted in this document were discussed and addressed.  A physical exam was not performed with this format.  I connected with Sean Gilbert on 04/26/20 at 1:55 by telephone and verified that I am speaking with the correct person using two identifiers. Sean Gilbert is currently located at home and no one is currently with him during visit. The provider, Mary-Margaret Daphine Deutscher, FNP is located in their office at time of visit.  I discussed the limitations, risks, security and privacy concerns of performing an evaluation and management service by telephone and the availability of in person appointments. I also discussed with the patient that there may be a patient responsible charge related to this service. The patient expressed understanding and agreed to proceed.   History and Present Illness:   Chief Complaint: Gastroesophageal Reflux   HPI Patient calls in today for an appointment for refill of GERD medication. He I on protonix daily and he says that work well to keep symptom under control.   Review of Systems  Constitutional: Negative for diaphoresis and weight loss.  Eyes: Negative for blurred vision, double vision and pain.  Respiratory: Negative for shortness of breath.   Cardiovascular: Negative for chest pain, palpitations, orthopnea and leg swelling.  Gastrointestinal: Positive for heartburn (occaional).  Skin: Negative for rash.  Neurological: Negative for dizziness, sensory change, loss of consciousness, weakness and headaches.  Endo/Heme/Allergies: Negative for polydipsia. Does not bruise/bleed easily.    Psychiatric/Behavioral: Negative for memory loss. The patient does not have insomnia.   All other systems reviewed and are negative.    Observations/Objective: Alert and oriented- answers all questions appropriately No distress    Assessment and Plan: Sean Gilbert in today with chief complaint of Gastroesophageal Reflux   1. Gastroesophageal reflux disease with esophagitis Avoid spicy foods Do not eat 2 hours prior to bedtime - pantoprazole (PROTONIX) 20 MG tablet; Take 1 tablet (20 mg total) by mouth daily.  Dispense: 30 tablet; Refill: 3    Follow Up Instructions: prn    I discussed the assessment and treatment plan with the patient. The patient was provided an opportunity to ask questions and all were answered. The patient agreed with the plan and demonstrated an understanding of the instructions.   The patient was advised to call back or seek an in-person evaluation if the symptoms worsen or if the condition fails to improve as anticipated.  The above assessment and management plan was discussed with the patient. The patient verbalized understanding of and has agreed to the management plan. Patient is aware to call the clinic if symptoms persist or worsen. Patient is aware when to return to the clinic for a follow-up visit. Patient educated on when it is appropriate to go to the emergency department.   Time call ended:  2:05  I provided 10 minutes of non-face-to-face time during this encounter.    Mary-Margaret Daphine Deutscher, FNP

## 2020-06-28 ENCOUNTER — Encounter: Payer: Self-pay | Admitting: Family Medicine

## 2020-06-28 ENCOUNTER — Ambulatory Visit (INDEPENDENT_AMBULATORY_CARE_PROVIDER_SITE_OTHER): Payer: 59 | Admitting: Family Medicine

## 2020-06-28 ENCOUNTER — Other Ambulatory Visit: Payer: Self-pay

## 2020-06-28 DIAGNOSIS — H6690 Otitis media, unspecified, unspecified ear: Secondary | ICD-10-CM | POA: Diagnosis not present

## 2020-06-28 DIAGNOSIS — J301 Allergic rhinitis due to pollen: Secondary | ICD-10-CM

## 2020-06-28 MED ORDER — AMOXICILLIN 250 MG PO CHEW: 500.0000 mg | CHEWABLE_TABLET | Freq: Two times a day (BID) | ORAL | 0 refills | Status: DC

## 2020-06-28 MED ORDER — FLUTICASONE PROPIONATE 50 MCG/ACT NA SUSP: 2.0000 | Freq: Every day | NASAL | 6 refills | Status: DC

## 2020-06-28 NOTE — Progress Notes (Signed)
Virtual Visit via telephone Note  I connected with Sean Gilbert on 06/28/20 at 1250 by telephone and verified that I am speaking with the correct person using two identifiers. Sean Gilbert is currently located at home and no other people are currently with her during visit. The provider, Elige Radon Maylen Waltermire, MD is located in their office at time of visit.  Call ended at 1258  I discussed the limitations, risks, security and privacy concerns of performing an evaluation and management service by telephone and the availability of in person appointments. I also discussed with the patient that there may be a patient responsible charge related to this service. The patient expressed understanding and agreed to proceed.   History and Present Illness: Patient is calling in for pain in his ear and it will go up to his temple.  He took some swimmers ear drops and it didn't seem to help.  It is both ears.  He denies any fevers or chills. He used to get ear infections and had tubes when he was little. He has this for 4 days. He has a slight improvement and it is a dull pain and headaches. He had a little drainage but not much.   No diagnosis found.  Outpatient Encounter Medications as of 06/28/2020  Medication Sig  . cetirizine (ZYRTEC) 10 MG tablet Take 1 tablet (10 mg total) by mouth daily.  . fluticasone (FLONASE) 50 MCG/ACT nasal spray Place 2 sprays into both nostrils daily.  . pantoprazole (PROTONIX) 20 MG tablet Take 1 tablet (20 mg total) by mouth daily.   No facility-administered encounter medications on file as of 06/28/2020.    Review of Systems  Constitutional: Negative for chills and fever.  HENT: Positive for congestion and ear pain. Negative for ear discharge, postnasal drip, rhinorrhea, sinus pressure, sneezing, sore throat and voice change.   Eyes: Negative for pain, discharge, redness and visual disturbance.  Respiratory: Negative for cough, shortness of breath and  wheezing.   Cardiovascular: Negative for chest pain and leg swelling.  Musculoskeletal: Negative for gait problem.  Skin: Negative for rash.  All other systems reviewed and are negative.   Observations/Objective: Patient sounds comfortable and in no acute distress  Assessment and Plan: Problem List Items Addressed This Visit      Respiratory   Non-seasonal allergic rhinitis due to pollen    Other Visit Diagnoses    Acute otitis media, unspecified otitis media type    -  Primary   Relevant Medications   amoxicillin (AMOXIL) 250 MG chewable tablet   fluticasone (FLONASE) 50 MCG/ACT nasal spray      Will treat like possible otitis media based on symptoms but does not improve return Follow up plan: Return if symptoms worsen or fail to improve.     I discussed the assessment and treatment plan with the patient. The patient was provided an opportunity to ask questions and all were answered. The patient agreed with the plan and demonstrated an understanding of the instructions.   The patient was advised to call back or seek an in-person evaluation if the symptoms worsen or if the condition fails to improve as anticipated.  The above assessment and management plan was discussed with the patient. The patient verbalized understanding of and has agreed to the management plan. Patient is aware to call the clinic if symptoms persist or worsen. Patient is aware when to return to the clinic for a follow-up visit. Patient educated on when it is appropriate  to go to the emergency department.    I provided 8 minutes of non-face-to-face time during this encounter.    Nils Pyle, MD

## 2020-10-14 ENCOUNTER — Encounter: Payer: Self-pay | Admitting: Family Medicine

## 2020-10-14 ENCOUNTER — Ambulatory Visit (INDEPENDENT_AMBULATORY_CARE_PROVIDER_SITE_OTHER): Payer: 59 | Admitting: Family Medicine

## 2020-10-14 ENCOUNTER — Telehealth: Payer: Self-pay | Admitting: Nurse Practitioner

## 2020-10-14 DIAGNOSIS — H6690 Otitis media, unspecified, unspecified ear: Secondary | ICD-10-CM

## 2020-10-14 MED ORDER — AMOXICILLIN-POT CLAVULANATE 875-125 MG PO TABS: 1.0000 | ORAL_TABLET | Freq: Two times a day (BID) | ORAL | 0 refills | Status: DC

## 2020-10-14 NOTE — Progress Notes (Signed)
   Virtual Visit via telephone Note  I connected with Sean Gilbert on 10/14/20 at 1701 by telephone and verified that I am speaking with the correct person using two identifiers. NYLE LIMB is currently located at home and patient are currently with her during visit. The provider, Elige Radon Yidel Teuscher, MD is located in their office at time of visit.  Call ended at 1708  I discussed the limitations, risks, security and privacy concerns of performing an evaluation and management service by telephone and the availability of in person appointments. I also discussed with the patient that there may be a patient responsible charge related to this service. The patient expressed understanding and agreed to proceed.   History and Present Illness: Patient is calling in for ear pain in the right ear that has been going on for 2 days and headache on that side.  He had sinus pressure a few days ago and it is improved. He had tubes when he was younger. He gets a lot of sinus problems. He is using flonase.   No diagnosis found.  Outpatient Encounter Medications as of 10/14/2020  Medication Sig  . amoxicillin (AMOXIL) 250 MG chewable tablet Chew 2 tablets (500 mg total) by mouth 2 (two) times daily.  . cetirizine (ZYRTEC) 10 MG tablet Take 1 tablet (10 mg total) by mouth daily.  . fluticasone (FLONASE) 50 MCG/ACT nasal spray Place 2 sprays into both nostrils daily.  . pantoprazole (PROTONIX) 20 MG tablet Take 1 tablet (20 mg total) by mouth daily.   No facility-administered encounter medications on file as of 10/14/2020.    Review of Systems  Constitutional: Negative for chills and fever.  HENT: Positive for congestion, ear pain and sinus pressure. Negative for postnasal drip, rhinorrhea, sinus pain, sneezing and sore throat.   Eyes: Negative for discharge.  Respiratory: Negative for cough, shortness of breath and wheezing.   Cardiovascular: Negative for chest pain and leg swelling.   Musculoskeletal: Negative for back pain and gait problem.  Skin: Negative for rash.  All other systems reviewed and are negative.   Observations/Objective: Patient sounds comfortable and in no acute distress  Assessment and Plan: Problem List Items Addressed This Visit    None    Visit Diagnoses    Acute otitis media, unspecified otitis media type    -  Primary   Relevant Medications   amoxicillin-clavulanate (AUGMENTIN) 875-125 MG tablet      Will treat like otitis media Follow up plan: Return if symptoms worsen or fail to improve.     I discussed the assessment and treatment plan with the patient. The patient was provided an opportunity to ask questions and all were answered. The patient agreed with the plan and demonstrated an understanding of the instructions.   The patient was advised to call back or seek an in-person evaluation if the symptoms worsen or if the condition fails to improve as anticipated.  The above assessment and management plan was discussed with the patient. The patient verbalized understanding of and has agreed to the management plan. Patient is aware to call the clinic if symptoms persist or worsen. Patient is aware when to return to the clinic for a follow-up visit. Patient educated on when it is appropriate to go to the emergency department.    I provided 7 minutes of non-face-to-face time during this encounter.    Nils Pyle, MD

## 2020-10-14 NOTE — Telephone Encounter (Signed)
Patient advised that a work note could not be completed for him until he has his televisit with Dr. Louanne Skye.  Explained that if he will let Dr. Louanne Skye know he needs a work note he will complete this for him.

## 2020-10-14 NOTE — Telephone Encounter (Signed)
Pt has a televisit appt today and was requesting that a note be sent to his mychart to excuse him him from work for today and yesterday

## 2020-12-27 ENCOUNTER — Encounter: Payer: Self-pay | Admitting: Family Medicine

## 2020-12-27 ENCOUNTER — Ambulatory Visit (INDEPENDENT_AMBULATORY_CARE_PROVIDER_SITE_OTHER): Payer: 59 | Admitting: Family Medicine

## 2020-12-27 DIAGNOSIS — R058 Other specified cough: Secondary | ICD-10-CM

## 2020-12-27 DIAGNOSIS — R0989 Other specified symptoms and signs involving the circulatory and respiratory systems: Secondary | ICD-10-CM | POA: Diagnosis not present

## 2020-12-27 MED ORDER — BENZONATATE 100 MG PO CAPS: 100.0000 mg | ORAL_CAPSULE | Freq: Three times a day (TID) | ORAL | 0 refills | Status: DC | PRN

## 2020-12-27 NOTE — Progress Notes (Signed)
Telephone visit  Subjective: CC: URI PCP: Bennie Pierini, FNP OHY:[id number] Sean Gilbert is a 25 y.o. male calls for telephone consult today. Patient provides verbal consent for consult held via phone.  Due to COVID-19 pandemic this visit was conducted virtually. This visit type was conducted due to national recommendations for restrictions regarding the COVID-19 Pandemic (e.g. social distancing, sheltering in place) in an effort to limit this patient's exposure and mitigate transmission in our community. All issues noted in this document were discussed and addressed.  A physical exam was not performed with this format.   Location of patient: home Location of provider: [facility] Others present for call: none  1. Cough/ congestion Patient reports that he has been having chest congestion that onset about 1 week ago.  He took Dayquil DM and that is helping.  He reports intermittent dry cough.  He feels easily short of breath (can't take a deep breath in like normal). No wheezing.  He has been hydrating well. Nonsmoker.  No fevers.  He reports fatigue.  No known sick contacts. Not vaccinated against COVID19.  He had COVID19 in 09/2020.   ROS: Per HPI  No Known Allergies Past Medical History:  Diagnosis Date  . Allergy     Current Outpatient Medications:  .  amoxicillin-clavulanate (AUGMENTIN) 875-125 MG tablet, Take 1 tablet by mouth 2 (two) times daily., Disp: 20 tablet, Rfl: 0 .  cetirizine (ZYRTEC) 10 MG tablet, Take 1 tablet (10 mg total) by mouth daily., Disp: 30 tablet, Rfl: 11 .  fluticasone (FLONASE) 50 MCG/ACT nasal spray, Place 2 sprays into both nostrils daily., Disp: 16 g, Rfl: 6 .  pantoprazole (PROTONIX) 20 MG tablet, Take 1 tablet (20 mg total) by mouth daily., Disp: 30 tablet, Rfl: 3  Assessment/ Plan: 25 y.o. male   Chest congestion - Plan: Novel Coronavirus, NAA ([facility])  Dry cough - Plan: Novel Coronavirus, NAA ([facility]), benzonatate (TESSALON PERLES) 100 MG  capsule  Will rule out COVID-19.  Tessalon Perles have been sent.  Advised to use Mucinex and drink plenty of water.  Home care instructions reviewed and reasons for return discussed.  Work note provided.  Follow-up as needed  Start time: 1:30pm End time: 1:36pm  Total time spent on patient care (including telephone call/ virtual visit): 6 minutes  Sean Gilbert [name], DO [facility] [phone]

## 2020-12-27 NOTE — Addendum Note (Signed)
Addended by: Margorie John on: 12/27/2020 02:16 PM   Modules accepted: Orders

## 2020-12-27 NOTE — Patient Instructions (Signed)

## 2020-12-28 LAB — NOVEL CORONAVIRUS, NAA: SARS-CoV-2, NAA: NOT DETECTED

## 2020-12-28 LAB — SARS-COV-2, NAA 2 DAY TAT

## 2021-03-25 ENCOUNTER — Encounter: Payer: Self-pay | Admitting: Nurse Practitioner

## 2021-03-25 ENCOUNTER — Ambulatory Visit (INDEPENDENT_AMBULATORY_CARE_PROVIDER_SITE_OTHER): Payer: 59

## 2021-03-25 ENCOUNTER — Ambulatory Visit (INDEPENDENT_AMBULATORY_CARE_PROVIDER_SITE_OTHER): Payer: 59 | Admitting: Nurse Practitioner

## 2021-03-25 ENCOUNTER — Other Ambulatory Visit: Payer: Self-pay

## 2021-03-25 VITALS — BP 128/84 | HR 72 | Temp 98.2°F | Ht 68.0 in | Wt 155.0 lb

## 2021-03-25 DIAGNOSIS — R109 Unspecified abdominal pain: Secondary | ICD-10-CM | POA: Insufficient documentation

## 2021-03-25 DIAGNOSIS — R1030 Lower abdominal pain, unspecified: Secondary | ICD-10-CM | POA: Diagnosis not present

## 2021-03-25 MED ORDER — IBUPROFEN 600 MG PO TABS: 600.0000 mg | ORAL_TABLET | Freq: Three times a day (TID) | ORAL | 0 refills | Status: DC | PRN

## 2021-03-25 NOTE — Assessment & Plan Note (Signed)
Right lower abdominal pain/flank pain not well controlled.  Symptoms in the last 7 days.  Patient denies nausea vomiting and fever.  Patient is not in acute distress. Completed abdominal x-ray-results pending.  Ibuprofen for pain.  Education provided to patient with printed handouts given.  Follow-up with worsening unresolved symptoms.

## 2021-03-25 NOTE — Progress Notes (Signed)
Acute Office Visit  Subjective:    Patient ID: Sean Gilbert, male    DOB: 07/27/1996, 24 y.o.   MRN: [id number]  Chief Complaint  Patient presents with  . Flank Pain    HPI Patient is in today for Pain  He reports new onset right lower quadrant pain. was not an injury that may have caused the pain. The pain started a few days ago and is staying constant. The pain does radiate right side back. The pain is described as aching, is 5/10 in intensity, occurring intermittently. Symptoms are worse in the: most of the day  Aggravating factors: none Relieving factors: none.  He has tried acetaminophen with no relief.   ---------------------------------------------------------------------------------------------------   Past Medical History:  Diagnosis Date  . Allergy     Past Surgical History:  Procedure Laterality Date  . TONSILLECTOMY    . tubes in ears      History reviewed. No pertinent family history.  Social History   Socioeconomic History  . Marital status: Single    Spouse name: Not on file  . Number of children: Not on file  . Years of education: Not on file  . Highest education level: Not on file  Occupational History  . Not on file  Tobacco Use  . Smoking status: Never Smoker  . Smokeless tobacco: Never Used  Vaping Use  . Vaping Use: Never used  Substance and Sexual Activity  . Alcohol use: No  . Drug use: No  . Sexual activity: Not on file  Other Topics Concern  . Not on file  Social History Narrative  . Not on file   Social Determinants of Health   Financial Resource Strain: Not on file  Food Insecurity: Not on file  Transportation Needs: Not on file  Physical Activity: Not on file  Stress: Not on file  Social Connections: Not on file  Intimate Partner Violence: Not on file    Outpatient Medications Prior to Visit  Medication Sig Dispense Refill  . benzonatate (TESSALON PERLES) 100 MG capsule Take 1 capsule (100 mg total) by mouth  3 (three) times daily as needed. 20 capsule 0  . cetirizine (ZYRTEC) 10 MG tablet Take 1 tablet (10 mg total) by mouth daily. 30 tablet 11  . fluticasone (FLONASE) 50 MCG/ACT nasal spray Place 2 sprays into both nostrils daily. 16 g 6  . pantoprazole (PROTONIX) 20 MG tablet Take 1 tablet (20 mg total) by mouth daily. 30 tablet 3   No facility-administered medications prior to visit.    No Known Allergies  Review of Systems  Constitutional: Negative.   HENT: Negative.   Respiratory: Negative.   Cardiovascular: Negative.   Gastrointestinal: Negative.   Musculoskeletal: Positive for back pain.       Left lower quadrant pain  Skin: Negative.   All other systems reviewed and are negative.      Objective:    Physical Exam Vitals reviewed.  Constitutional:      General: He is awake.     Appearance: Normal appearance. He is well-developed.     Interventions: Face mask in place.    HENT:     Head: Normocephalic.     Nose: Nose normal.  Eyes:     Conjunctiva/sclera: Conjunctivae normal.  Cardiovascular:     Rate and Rhythm: Normal rate and regular rhythm.  Pulmonary:     Effort: Pulmonary effort is normal.     Breath sounds: Normal breath sounds.  Abdominal:  General: Abdomen is flat. Bowel sounds are normal. There is no distension. There are no signs of injury.     Palpations: Abdomen is soft. There is no mass.     Tenderness: There is abdominal tenderness in the right lower quadrant. There is right CVA tenderness. There is no guarding or rebound. Negative signs include Murphy's sign.  Musculoskeletal:        General: Tenderness present.  Neurological:     Mental Status: He is alert and oriented to person, place, and time.  Psychiatric:        Behavior: Behavior is cooperative.     BP 128/84   Pulse 72   Temp 98.2 F (36.8 C) (Temporal)   Ht 5\' 8"  (1.727 m)   Wt 155 lb (70.3 kg)   SpO2 99%   BMI 23.57 kg/m  Wt Readings from Last 3 Encounters:  03/25/21 155  lb (70.3 kg)  03/05/20 150 lb (68 kg)  09/04/19 147 lb 9.6 oz (67 kg)    Health Maintenance Due  Topic Date Due  . Hepatitis C Screening  Never done  . HPV VACCINES (1 - Male 2-dose series) Never done  . HIV Screening  Never done  . TETANUS/TDAP  06/25/2017       Topic Date Due  . HPV VACCINES (1 - Male 2-dose series) Never done     No results found for: TSH Lab Results  Component Value Date   WBC 4.2 09/04/2019   HGB 15.7 09/04/2019   HCT 45.2 09/04/2019   MCV 88 09/04/2019   PLT 325 09/04/2019   Lab Results  Component Value Date   NA 138 08/18/2015   K 3.3 (L) 08/18/2015   CO2 27 08/18/2015   GLUCOSE 109 (H) 08/18/2015   BUN 11 08/18/2015   CREATININE 0.75 08/18/2015   BILITOT 0.6 08/18/2015   ALKPHOS 50 08/18/2015   AST 19 08/18/2015   ALT 19 08/18/2015   PROT 8.5 (H) 08/18/2015   ALBUMIN 5.2 (H) 08/18/2015   CALCIUM 9.6 08/18/2015   ANIONGAP 8 08/18/2015       Assessment & Plan:   Problem List Items Addressed This Visit      Other   Lower abdominal pain - Primary    Right lower abdominal pain/flank pain not well controlled.  Symptoms in the last 7 days.  Patient denies nausea vomiting and fever.  Patient is not in acute distress. Completed abdominal x-ray-results pending.  Ibuprofen for pain.  Education provided to patient with printed handouts given.  Follow-up with worsening unresolved symptoms.      Relevant Medications   ibuprofen (ADVIL) 600 MG tablet   Other Relevant Orders   DG Abd 2 Views       Meds ordered this encounter  Medications  . ibuprofen (ADVIL) 600 MG tablet    Sig: Take 1 tablet (600 mg total) by mouth every 8 (eight) hours as needed.    Dispense:  30 tablet    Refill:  0    Order Specific Question:   Supervising Provider    Answer:   08/20/2015 Raliegh Ip     [[id number]], NP

## 2021-03-25 NOTE — Patient Instructions (Signed)

## 2021-05-02 ENCOUNTER — Encounter: Payer: Self-pay | Admitting: Nurse Practitioner

## 2021-05-02 ENCOUNTER — Ambulatory Visit (INDEPENDENT_AMBULATORY_CARE_PROVIDER_SITE_OTHER): Payer: 59 | Admitting: Nurse Practitioner

## 2021-05-02 DIAGNOSIS — J301 Allergic rhinitis due to pollen: Secondary | ICD-10-CM | POA: Diagnosis not present

## 2021-05-02 MED ORDER — FLUTICASONE PROPIONATE 50 MCG/ACT NA SUSP: 2.0000 | Freq: Every day | NASAL | 6 refills | Status: DC

## 2021-05-02 MED ORDER — CETIRIZINE HCL 10 MG PO TABS: 10.0000 mg | ORAL_TABLET | Freq: Every day | ORAL | 3 refills | Status: DC

## 2021-05-02 NOTE — Progress Notes (Signed)
   Virtual Visit  Note Due to COVID-19 pandemic this visit was conducted virtually. This visit type was conducted due to national recommendations for restrictions regarding the COVID-19 Pandemic (e.g. social distancing, sheltering in place) in an effort to limit this patient's exposure and mitigate transmission in our community. All issues noted in this document were discussed and addressed.  A physical exam was not performed with this format.  I connected with Sean Gilbert on 05/02/21 at 3:08 by telephone and verified that I am speaking with the correct person using two identifiers. Sean Gilbert is currently located in his car and his girlfriend is currently with him during visit. The provider, Mary-Margaret Daphine Deutscher, FNP is located in their office at time of visit.  I discussed the limitations, risks, security and privacy concerns of performing an evaluation and management service by telephone and the availability of in person appointments. I also discussed with the patient that there may be a patient responsible charge related to this service. The patient expressed understanding and agreed to proceed.   History and Present Illness:   Chief Complaint: Sinusitis   HPI Patient calls in stating that his allergies have gotten really bad. Congestion, runny  Nose and sneezing.   Review of Systems  Constitutional: Negative for chills and fever.  HENT: Positive for congestion. Negative for sinus pain and sore throat.   Respiratory: Negative for cough and shortness of breath.   Neurological: Negative.   Psychiatric/Behavioral: Negative.      Observations/Objective: Alert and oriented- answers all questions appropriately No distress Voice hoarse no cough noted  Assessment and Plan: Sean Gilbert in today with chief complaint of Sinusitis   1. Non-seasonal allergic rhinitis due to pollen Force fluids Avoid allergens if possibLe - fluticasone (FLONASE) 50  MCG/ACT nasal spray; Place 2 sprays into both nostrils daily.  Dispense: 16 g; Refill: 6 - cetirizine (ZYRTEC) 10 MG tablet; Take 1 tablet (10 mg total) by mouth daily.  Dispense: 30 tablet; Refill: 3    Follow Up Instructions: prn    I discussed the assessment and treatment plan with the patient. The patient was provided an opportunity to ask questions and all were answered. The patient agreed with the plan and demonstrated an understanding of the instructions.   The patient was advised to call back or seek an in-person evaluation if the symptoms worsen or if the condition fails to improve as anticipated.  The above assessment and management plan was discussed with the patient. The patient verbalized understanding of and has agreed to the management plan. Patient is aware to call the clinic if symptoms persist or worsen. Patient is aware when to return to the clinic for a follow-up visit. Patient educated on when it is appropriate to go to the emergency department.   Time call ended:  3:20  I provided 12 minutes of  non face-to-face time during this encounter.    Mary-Margaret Daphine Deutscher, FNP

## 2021-05-13 ENCOUNTER — Encounter: Payer: Self-pay | Admitting: Nurse Practitioner

## 2021-05-13 ENCOUNTER — Ambulatory Visit (INDEPENDENT_AMBULATORY_CARE_PROVIDER_SITE_OTHER): Payer: 59 | Admitting: Nurse Practitioner

## 2021-05-13 ENCOUNTER — Ambulatory Visit (INDEPENDENT_AMBULATORY_CARE_PROVIDER_SITE_OTHER): Payer: 59

## 2021-05-13 ENCOUNTER — Other Ambulatory Visit: Payer: Self-pay

## 2021-05-13 VITALS — BP 124/82 | HR 94 | Temp 97.9°F | Ht 68.0 in | Wt 154.0 lb

## 2021-05-13 DIAGNOSIS — R1012 Left upper quadrant pain: Secondary | ICD-10-CM | POA: Diagnosis not present

## 2021-05-13 LAB — CBC WITH DIFFERENTIAL/PLATELET
Basophils Absolute: 0.1 10*3/uL (ref 0.0–0.2)
Basos: 1 %
EOS (ABSOLUTE): 0.2 10*3/uL (ref 0.0–0.4)
Eos: 3 %
Hematocrit: 45.1 % (ref 37.5–51.0)
Hemoglobin: 15.4 g/dL (ref 13.0–17.7)
Immature Grans (Abs): 0 10*3/uL (ref 0.0–0.1)
Immature Granulocytes: 0 %
Lymphocytes Absolute: 2.2 10*3/uL (ref 0.7–3.1)
Lymphs: 42 %
MCH: 30.6 pg (ref 26.6–33.0)
MCHC: 34.1 g/dL (ref 31.5–35.7)
MCV: 90 fL (ref 79–97)
Monocytes Absolute: 0.5 10*3/uL (ref 0.1–0.9)
Monocytes: 11 %
Neutrophils Absolute: 2.2 10*3/uL (ref 1.4–7.0)
Neutrophils: 43 %
Platelets: 368 10*3/uL (ref 150–450)
RBC: 5.04 x10E6/uL (ref 4.14–5.80)
RDW: 12.3 % (ref 11.6–15.4)
WBC: 5.1 10*3/uL (ref 3.4–10.8)

## 2021-05-13 NOTE — Patient Instructions (Signed)

## 2021-05-13 NOTE — Progress Notes (Signed)
Acute Office Visit  Subjective:    Patient ID: Sean Gilbert, male    DOB: 21-Jul-1996, 25 y.o.   MRN: [id number]  Chief Complaint  Patient presents with  . Flank Pain    Flank Pain This is a new problem. The current episode started yesterday. The problem occurs constantly. The problem has been gradually worsening since onset. The quality of the pain is described as aching. The pain does not radiate. The pain is moderate. The pain is the same all the time. The symptoms are aggravated by position, twisting and bending. Stiffness is present all day. Associated symptoms include abdominal pain. Pertinent negatives include no bowel incontinence, chest pain, numbness, paresthesias or pelvic pain. Risk factors: boxing with a friend. He has tried nothing for the symptoms.   Past Medical History:  Diagnosis Date  . Allergy     Past Surgical History:  Procedure Laterality Date  . TONSILLECTOMY    . tubes in ears      History reviewed. No pertinent family history.  Social History   Socioeconomic History  . Marital status: Single    Spouse name: Not on file  . Number of children: Not on file  . Years of education: Not on file  . Highest education level: Not on file  Occupational History  . Not on file  Tobacco Use  . Smoking status: Never Smoker  . Smokeless tobacco: Never Used  Vaping Use  . Vaping Use: Never used  Substance and Sexual Activity  . Alcohol use: No  . Drug use: No  . Sexual activity: Not on file  Other Topics Concern  . Not on file  Social History Narrative  . Not on file   Social Determinants of Health   Financial Resource Strain: Not on file  Food Insecurity: Not on file  Transportation Needs: Not on file  Physical Activity: Not on file  Stress: Not on file  Social Connections: Not on file  Intimate Partner Violence: Not on file    Outpatient Medications Prior to Visit  Medication Sig Dispense Refill  . cetirizine (ZYRTEC) 10 MG tablet  Take 1 tablet (10 mg total) by mouth daily. 30 tablet 3  . fluticasone (FLONASE) 50 MCG/ACT nasal spray Place 2 sprays into both nostrils daily. 16 g 6  . ibuprofen (ADVIL) 600 MG tablet Take 1 tablet (600 mg total) by mouth every 8 (eight) hours as needed. 30 tablet 0  . pantoprazole (PROTONIX) 20 MG tablet Take 1 tablet (20 mg total) by mouth daily. 30 tablet 3   No facility-administered medications prior to visit.    No Known Allergies  Review of Systems  Constitutional: Negative.   HENT: Negative.   Respiratory: Negative.   Cardiovascular: Negative for chest pain.  Gastrointestinal: Positive for abdominal pain. Negative for bowel incontinence.  Genitourinary: Positive for flank pain. Negative for pelvic pain.  Neurological: Negative for numbness and paresthesias.  All other systems reviewed and are negative.      Objective:    Physical Exam Vitals reviewed.  Constitutional:      Appearance: Normal appearance.  HENT:     Head: Normocephalic.     Nose: Nose normal.  Cardiovascular:     Rate and Rhythm: Normal rate and regular rhythm.     Pulses: Normal pulses.     Heart sounds: Normal heart sounds.  Pulmonary:     Effort: Pulmonary effort is normal.     Breath sounds: Normal breath sounds.  Abdominal:  General: Bowel sounds are normal.     Tenderness: There is abdominal tenderness in the left upper quadrant and left lower quadrant. There is no right CVA tenderness, left CVA tenderness, guarding or rebound.       Comments: Left flank tenderness and pain  Musculoskeletal:       Arms:     Comments: Left flank pain  Neurological:     Mental Status: He is alert and oriented to person, place, and time.  Psychiatric:        Behavior: Behavior normal.     BP 124/82   Pulse 94   Temp 97.9 F (36.6 C) (Temporal)   Ht 5\' 8"  (1.727 m)   Wt 154 lb (69.9 kg)   BMI 23.42 kg/m  Wt Readings from Last 3 Encounters:  05/13/21 154 lb (69.9 kg)  03/25/21 155 lb (70.3  kg)  03/05/20 150 lb (68 kg)    Health Maintenance Due  Topic Date Due  . HPV VACCINES (1 - Male 2-dose series) Never done  . HIV Screening  Never done  . Hepatitis C Screening  Never done       Topic Date Due  . HPV VACCINES (1 - Male 2-dose series) Never done     No results found for: TSH Lab Results  Component Value Date   WBC 5.1 05/13/2021   HGB 15.4 05/13/2021   HCT 45.1 05/13/2021   MCV 90 05/13/2021   PLT 368 05/13/2021   Lab Results  Component Value Date   NA 138 08/18/2015   K 3.3 (L) 08/18/2015   CO2 27 08/18/2015   GLUCOSE 109 (H) 08/18/2015   BUN 11 08/18/2015   CREATININE 0.75 08/18/2015   BILITOT 0.6 08/18/2015   ALKPHOS 50 08/18/2015   AST 19 08/18/2015   ALT 19 08/18/2015   PROT 8.5 (H) 08/18/2015   ALBUMIN 5.2 (H) 08/18/2015   CALCIUM 9.6 08/18/2015   ANIONGAP 8 08/18/2015      Assessment & Plan:   Problem List Items Addressed This Visit      Other   Left upper quadrant abdominal pain - Primary    Unresolved pain in the last 24 hours.  Patient was boxing with a friend and he was accidentally hit several times left flank/left abdominal area.  Patient reports pain is worse and not resolving.  Completed abdominal x-ray to rule out spleen rupture/any internal abdominal injury.  Advised patient to use Tylenol/ibuprofen for pain.  CBC completed Follow-up with worsening unresolved symptoms.  RX sent to pharmacy.      Relevant Orders   DG Abd 2 Views   CBC with Differential (Completed)       08/20/2015, NP

## 2021-05-14 NOTE — Assessment & Plan Note (Signed)
Unresolved pain in the last 24 hours.  Patient was boxing with a friend and he was accidentally hit several times left flank/left abdominal area.  Patient reports pain is worse and not resolving.  Completed abdominal x-ray to rule out spleen rupture/any internal abdominal injury.  Advised patient to use Tylenol/ibuprofen for pain.  CBC completed Follow-up with worsening unresolved symptoms.  RX sent to pharmacy.

## 2021-06-11 ENCOUNTER — Other Ambulatory Visit: Payer: Self-pay

## 2021-06-11 ENCOUNTER — Emergency Department (HOSPITAL_COMMUNITY)
Admission: EM | Admit: 2021-06-11 | Discharge: 2021-06-11 | Disposition: A | Discharge status: Home / Self Care | Payer: Managed Care, Other (non HMO) | Attending: Emergency Medicine | Admitting: Emergency Medicine

## 2021-06-11 DIAGNOSIS — J4520 Mild intermittent asthma, uncomplicated: Secondary | ICD-10-CM | POA: Insufficient documentation

## 2021-06-11 DIAGNOSIS — Z7951 Long term (current) use of inhaled steroids: Secondary | ICD-10-CM | POA: Diagnosis not present

## 2021-06-11 DIAGNOSIS — W57XXXA Bitten or stung by nonvenomous insect and other nonvenomous arthropods, initial encounter: Secondary | ICD-10-CM | POA: Insufficient documentation

## 2021-06-11 DIAGNOSIS — R21 Rash and other nonspecific skin eruption: Secondary | ICD-10-CM | POA: Diagnosis not present

## 2021-06-11 DIAGNOSIS — S30862A Insect bite (nonvenomous) of penis, initial encounter: Secondary | ICD-10-CM | POA: Diagnosis present

## 2021-06-11 MED ORDER — DOXYCYCLINE HYCLATE 100 MG PO CAPS: 100.0000 mg | ORAL_CAPSULE | Freq: Two times a day (BID) | ORAL | 0 refills | Status: DC

## 2021-06-11 MED ORDER — DIPHENHYDRAMINE HCL 25 MG PO TABS: 25.0000 mg | ORAL_TABLET | Freq: Four times a day (QID) | ORAL | 0 refills | Status: DC | PRN

## 2021-06-11 MED ORDER — DOXYCYCLINE HYCLATE 100 MG PO TABS
200.0000 mg | ORAL_TABLET | Freq: Once | ORAL | Status: AC
  Administered 2021-06-11: 200 mg via ORAL
  Filled 2021-06-11: qty 2

## 2021-06-11 NOTE — Discharge Instructions (Addendum)
You have been seen for tick exposure.  We have successfully removed multiple small ticks but there's always a chance that there may be more.  Please monitor closely for additional ticks.  It is likely that we have removed it before it can transfer tick-born disease.  However, if you noticed headache, fever, joint pain then please take doxycycline prescribed for the full duration and consider return for further testing.  Take benadryl as needed for itchiness.

## 2021-06-11 NOTE — ED Triage Notes (Signed)
Patient reports he went swimming yesterday, began breaking out. Patient says he then noticed three ticks on his penis. Patient says he pulled one off but it was painful. Denies pain in triage.

## 2021-06-11 NOTE — ED Provider Notes (Signed)
[facility]-EMERGENCY DEPT Provider Note   CSN: [id number] Arrival date & time: 06/11/21  1637     History Chief Complaint  Patient presents with   tick penis    Sean Gilbert is a 25 y.o. male.  The history is provided by the patient. No language interpreter was used.   25 year old male presenting with concerns of tick exposure.  Patient report yesterday he went swimming in his aunts chlorine pool.  Afterward he noticed a an itchy rash that spread throughout his body.  Upon further examination he found 6 ticks that he was able to pull off however he also noticed several more ticks embedded on his penis that was too small to remove thus prompting this ER visit.  He denies any fever headache joint pain.  He believes tick exposure is likely less than 24 hours.  He has not been in the woods but does have a dog and unsure if it may come from his dog.  No other specific treatment tried.  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  Social History   Tobacco Use   Smoking status: Never   Smokeless tobacco: Never  Vaping Use   Vaping Use: Never used  Substance Use Topics   Alcohol use: No   Drug use: No    Home Medications Prior to Admission medications   Medication Sig Start Date End Date Taking? Authorizing Provider  cetirizine (ZYRTEC) 10 MG tablet Take 1 tablet (10 mg total) by mouth daily.  05/02/21   Daphine Deutscher, Mary-Margaret, FNP  fluticasone (FLONASE) 50 MCG/ACT nasal spray Place 2 sprays into both nostrils daily. 05/02/21   Daphine Deutscher, Mary-Margaret, FNP  ibuprofen (ADVIL) 600 MG tablet Take 1 tablet (600 mg total) by mouth every 8 (eight) hours as needed. 03/25/21   Daryll Drown, NP  pantoprazole (PROTONIX) 20 MG tablet Take 1 tablet (20 mg total) by mouth daily. 04/26/20   Bennie Pierini, FNP    Allergies    Patient has no known allergies.  Review of Systems   Review of Systems  Constitutional:  Negative for fatigue and fever.  Skin:  Positive for rash.   Physical Exam Updated Vital Signs BP (!) 144/93 (BP Location: Left Arm)   Pulse 89   Temp 97.9 F (36.6 C) (Oral)   Resp 20   Ht 5\' 8"  (1.727 m)   Wt 70.3 kg   SpO2 100%   BMI 23.57 kg/m   Physical Exam Vitals and nursing note reviewed.  Constitutional:      General: He is not in acute distress.    Appearance: He is well-developed.  HENT:     Head: Atraumatic.  Eyes:     Conjunctiva/sclera: Conjunctivae normal.  Musculoskeletal:     Cervical back: Neck supple.  Skin:    Findings: Rash (Diffuse scattered maculopapular rash noted throughout body most significant  to bilateral thigh, upper back and abdomen.) present.     Comments: Multiple less than 2 mm live ticks were noted embedded throughout body, which includes neck, back, abdomen, arms, urethral meatus, scrotum, thighs, calves.  Exam was done with appropriate chaperone.   Neurological:     Mental Status: He is alert. Mental status is at baseline.    ED Results / Procedures / Treatments   Labs (all labs ordered are listed, but only abnormal results are displayed) Labs Reviewed - No data to display  EKG None  Radiology No results found.  Procedures .Foreign Body Removal  Date/Time: 06/11/2021 5:35 PM Performed by: Fayrene Helper, PA-C Authorized by: Fayrene Helper, PA-C  Consent: Verbal consent obtained. Risks and benefits: risks, benefits  and alternatives were discussed Consent given by: patient Patient understanding: patient states understanding of the procedure being performed Patient consent: the patient's understanding of the procedure matches consent given Patient identity confirmed: verbally with patient Body area: skin General location: trunk  Sedation: Patient sedated: no  Localization method: visualized Removal mechanism: forceps Depth: epidermis. Complexity: complex 14 objects recovered. Objects recovered: ticks Post-procedure assessment: foreign body removed Patient tolerance: patient tolerated the procedure well with no immediate complications    Medications Ordered in ED Medications  doxycycline (VIBRA-TABS) tablet 200 mg (200 mg Oral Given 06/11/21 1739)    ED Course  I have reviewed the triage vital signs and the nursing notes.  Pertinent labs & imaging results that were available during my care of the patient were reviewed by me and considered in my medical decision making (see chart for details).    MDM Rules/Calculators/A&P                          BP 138/82 (BP Location: Right Arm)   Pulse 71   Temp 98.5 F (36.9 C) (Oral)   Resp 16   Ht 5\' 8"  (1.727 m)   Wt 70.3 kg   SpO2 98%   BMI 23.57 kg/m   Final Clinical Impression(s) / ED Diagnoses Final diagnoses:  Tick bite with subsequent removal of tick    Rx / DC Orders ED Discharge Orders          Ordered    diphenhydrAMINE (BENADRYL) 25 MG tablet  Every 6 hours PRN        06/11/21 1739    doxycycline (VIBRAMYCIN) 100 MG capsule  2 times daily        06/11/21 1739           5:32 PM Patient here due to noticing tick on his skin.  He reportedly removed 6 ticks PTA but unable to remove several ticks on the tip of his penis.  On exam multiple very small ticks <41mm in size were noted throughout body that I retrieved using tweezer.  Pt tolerates well.  Pt does have maculopapular rash.  Will treat with benadryl, will give one  time dose of doxycycline 200mg  as prophylaxis.  Pt made aware to return if he develop headache, joint pain, fever, or worsening rash.  Ticks are  likely on skin for <24hrs.  None appears engorged.     3m, PA-C 06/11/21 1742    Fayrene Helper, MD 06/13/21 [phone]

## 2021-06-13 ENCOUNTER — Ambulatory Visit (INDEPENDENT_AMBULATORY_CARE_PROVIDER_SITE_OTHER): Payer: 59 | Admitting: Family Medicine

## 2021-06-13 DIAGNOSIS — Z91038 Other insect allergy status: Secondary | ICD-10-CM

## 2021-06-13 DIAGNOSIS — W57XXXA Bitten or stung by nonvenomous insect and other nonvenomous arthropods, initial encounter: Secondary | ICD-10-CM | POA: Diagnosis not present

## 2021-06-13 MED ORDER — PREDNISONE 10 MG (21) PO TBPK: ORAL_TABLET | ORAL | 0 refills | Status: DC

## 2021-06-13 MED ORDER — DOXYCYCLINE HYCLATE 100 MG PO CAPS: 100.0000 mg | ORAL_CAPSULE | Freq: Two times a day (BID) | ORAL | 0 refills | Status: AC

## 2021-06-13 NOTE — Patient Instructions (Signed)
Lyme Disease Lyme disease is an infection that can affect many parts of the body, including the skin, joints, and nervous system. It is a bacterial infection that starts from the bite of an infected tick. Over time, the infection can worsen, and some of the symptoms are similar to the flu. If Lyme disease is not treated, it may cause joint pain, swelling, numbness, problems thinking, fatigue, muscleweakness, and other problems. What are the causes? This condition is caused by bacteria called Borrelia burgdorferi. You can get Lyme disease by being bitten by an infected tick. Only black-legged, or Ixodes, ticks that are infected with the bacteria can cause Lyme disease. The tick must be attached to your skin for a certain period of time to pass along the infection. This is usually 36-48 hours. Deer often carry infected ticks. What increases the risk? The following factors may make you more likely to develop this condition: Living in or visiting these areas in the [place]. The [place]. [place]. Spending time in wooded or grassy areas. Being outdoors with exposed skin. Camping, gardening, hiking, fishing, hunting, or working outdoors. Failing to remove a tick from your skin. What are the signs or symptoms? Symptoms of this condition may include: Chills and fever. Headache. Fatigue. General achiness. Muscle pain. Joint pain, often in the knees. A round, red rash that surrounds the center of the tick bite. The center of the rash may be blood colored or have tiny blisters. Swollen lymph glands. Stiff neck. How is this diagnosed? This condition is diagnosed based on: Your symptoms and medical history. A physical exam. A blood test. How is this treated? The main treatment for this condition is antibiotic medicine, which is usually taken by mouth (orally). The length of treatment depends on how soon after a tick bite you begin taking the medicine. In some  cases, treatment is necessary for several weeks. If the infection is severe, antibiotics may need to be given through an IV that is inserted into one of your veins. Follow these instructions at home: Take over-the-counter and prescription medicines only as told by your health care provider. Finish all antibiotic medicine, even when you start to feel better. Ask your health care provider about taking a probiotic in between doses of your antibiotic to help avoid an upset stomach or diarrhea. Check with your health care provider before supplementing your treatment. Many alternative therapies have not been proven and may be harmful to you. Keep all follow-up visits as told by your health care provider. This is important. How is this prevented? You can become reinfected if you get another tick bite from an infected tick. Take these steps to help prevent an infection: Cover your skin with light-colored clothing when you are outdoors in the spring and summer months. Spray clothing and skin with bug spray. The spray should be 20-30% DEET. You can also treat clothing with permethrin, and let it dry before you wear it. Do not apply permethrin directly to your skin. Permethrin can also be used to treat camping gear and boots. Always read and follow the instructions that come with a bug spray or insecticide. Avoid wooded, grassy, and shaded areas. Remove yard litter, brush, trash, and plants that attract deer and rodents. Check yourself for ticks when you come indoors. Wash clothing worn each day. Shower after spending time outdoors. Check your pets for ticks before they come inside. If you find a tick attached to your skin: Remove it with tweezers. Clean   your hands and the bite area with rubbing alcohol or soap and water. Dispose of the tick by putting it in rubbing alcohol, putting it in a sealed bag or container, or flushing it down the toilet. You may choose to save the tick in a sealed container if you  wish for it to be tested at a later time. Pregnant women should take special care to avoid tick bites because it ispossible that the infection may be passed along to the fetus. Contact a health care provider if: You have symptoms after treatment. You have removed a tick and want to bring it to your health care provider for testing. Get help right away if: You have an irregular heartbeat. You have chest pain. You have nerve pain. Your face feels numb. You develop the following: A stiff neck. A severe headache. Severe nausea and vomiting. Sensitivity to light. Summary Lyme disease is an infection that can affect many parts of the body, including the skin, joints, and nervous system. This condition is caused by bacteria called Borrelia burgdorferi. You can get Lyme disease by being bitten by an infected tick. The main treatment for this condition is antibiotic medicine. This information is not intended to replace advice given to you by your health care provider. Make sure you discuss any questions you have with your healthcare provider. Document Revised: 03/28/2019 Document Reviewed: 02/20/2019 Elsevier Patient Education  2022 Elsevier Inc.  

## 2021-06-13 NOTE — Progress Notes (Signed)
Telephone visit  Subjective: CC: f/u tick bites PCP: Bennie Pierini, FNP VOH:[id number] Kathie Rhodes Friedlander is a 25 y.o. male calls for telephone consult today. Patient provides verbal consent for consult held via phone.  Due to COVID-19 pandemic this visit was conducted virtually. This visit type was conducted due to national recommendations for restrictions regarding the COVID-19 Pandemic (e.g. social distancing, sheltering in place) in an effort to limit this patient's exposure and mitigate transmission in our community. All issues noted in this document were discussed and addressed.  A physical exam was not performed with this format.   Location of patient: home Location of provider: [facility] Others present for call: none  1. Tick bites Patient reports that he went to ED on 6/25 for multiple tick removals.  He reports hives on legs, arms and back.  He was given doxycycline for tick bites. He reports fatigue, hot flashes.  Has been using over-the-counter eczema cream in efforts to improve rash.  No reports of bull's-eye rash.  No measured fevers.  No known sick contacts.  ROS: Per HPI  No Known Allergies Past Medical History:  Diagnosis Date   Allergy     Current Outpatient Medications:    cetirizine (ZYRTEC) 10 MG tablet, Take 1 tablet (10 mg total) by mouth daily., Disp: 30 tablet, Rfl: 3   diphenhydrAMINE (BENADRYL) 25 MG tablet, Take 1 tablet (25 mg total) by mouth every 6 (six) hours as needed for itching., Disp: 30 tablet, Rfl: 0   doxycycline (VIBRAMYCIN) 100 MG capsule, Take 1 capsule (100 mg total) by mouth 2 (two) times daily. One po bid x 7 days, Disp: 28 capsule, Rfl: 0   fluticasone (FLONASE) 50 MCG/ACT nasal spray, Place 2 sprays into both nostrils daily., Disp: 16 g, Rfl: 6   ibuprofen (ADVIL) 600 MG tablet, Take 1 tablet (600 mg total) by mouth every 8 (eight) hours as needed., Disp: 30 tablet, Rfl: 0   pantoprazole (PROTONIX) 20 MG tablet, Take 1 tablet (20 mg total) by  mouth daily., Disp: 30 tablet, Rfl: 3  Assessment/ Plan: 25 y.o. male   Allergic reaction to insect bite - Plan: predniSONE (STERAPRED UNI-PAK 21 TAB) 10 MG (21) TBPK tablet  Tick bite, unspecified site, initial encounter - Plan: doxycycline (VIBRAMYCIN) 100 MG capsule, predniSONE (STERAPRED UNI-PAK 21 TAB) 10 MG (21) TBPK tablet  I am going to treat him with prednisone given widespread hives due to recent multiple tick bites.  I have also extended his doxycycline out to cover for Lyme disease.  Do not think that 1 week is sufficient to cover for this.  We discussed that he would be on this for at least 2 to 3 weeks.  We discussed red flag signs and symptoms warranting further evaluation.  He voiced good understanding.  Work note provided.  Follow-up with PCP as needed  Start time: 2:13pm End time: 2:18pm  Total time spent on patient care (including telephone call/ virtual visit): 5 minutes  Devonna Oboyle [name], DO [facility] [phone]

## 2021-07-11 ENCOUNTER — Encounter: Payer: Self-pay | Admitting: Nurse Practitioner

## 2021-07-11 ENCOUNTER — Ambulatory Visit (INDEPENDENT_AMBULATORY_CARE_PROVIDER_SITE_OTHER): Payer: 59 | Admitting: Nurse Practitioner

## 2021-07-11 DIAGNOSIS — J0110 Acute frontal sinusitis, unspecified: Secondary | ICD-10-CM | POA: Diagnosis not present

## 2021-07-11 MED ORDER — AMOXICILLIN-POT CLAVULANATE 875-125 MG PO TABS: 1.0000 | ORAL_TABLET | Freq: Two times a day (BID) | ORAL | 0 refills | Status: DC

## 2021-07-11 NOTE — Progress Notes (Signed)
   Virtual Visit  Note Due to COVID-19 pandemic this visit was conducted virtually. This visit type was conducted due to national recommendations for restrictions regarding the COVID-19 Pandemic (e.g. social distancing, sheltering in place) in an effort to limit this patient's exposure and mitigate transmission in our community. All issues noted in this document were discussed and addressed.  A physical exam was not performed with this format.  I connected with Sean Gilbert on 07/11/21 at 12:30 PM by telephone and verified that I am speaking with the correct person using two identifiers. Sean Gilbert is currently located at home during visit. The provider, Daryll Drown, NP is located in their office at time of visit.  I discussed the limitations, risks, security and privacy concerns of performing an evaluation and management service by telephone and the availability of in person appointments. I also discussed with the patient that there may be a patient responsible charge related to this service. The patient expressed understanding and agreed to proceed.   History and Present Illness:  Sinusitis This is a new problem. Episode onset: In the past 3 to 4 days. The problem has been gradually worsening since onset. Maximum temperature: 99.5. The fever has been present for 1 to 2 days. The pain is moderate. Associated symptoms include chills, ear pain, sinus pressure and a sore throat. Past treatments include nothing.     Review of Systems  Constitutional:  Positive for chills. Negative for malaise/fatigue.  HENT:  Positive for ear pain, sinus pressure, sinus pain and sore throat.   Skin:  Negative for rash.  All other systems reviewed and are negative.   Observations/Objective: Televisit patient not in distress.  Assessment and Plan:  Subacute frontal sinusitis COVID-19 test negative.  Worsening signs and symptoms of sinusitis with the ear ache, sore throat, low-grade  fever, sinus pressure, and facial pain.  Augmentin 875-125 mg tablet twice daily by mouth.  Tylenol for headache and fever, follow-up with unresolved symptoms.  Rx sent to pharmacy.  Follow Up Instructions:  Follow up with worsening symptoms   I discussed the assessment and treatment plan with the patient. The patient was provided an opportunity to ask questions and all were answered. The patient agreed with the plan and demonstrated an understanding of the instructions.   The patient was advised to call back or seek an in-person evaluation if the symptoms worsen or if the condition fails to improve as anticipated.  The above assessment and management plan was discussed with the patient. The patient verbalized understanding of and has agreed to the management plan. Patient is aware to call the clinic if symptoms persist or worsen. Patient is aware when to return to the clinic for a follow-up visit. Patient educated on when it is appropriate to go to the emergency department.   Time call ended: 12:39 pm    I provided 9 minutes of  non face-to-face time during this encounter.    Daryll Drown, NP

## 2021-07-11 NOTE — Patient Instructions (Signed)

## 2021-07-11 NOTE — Assessment & Plan Note (Signed)
COVID-19 test negative.  Worsening signs and symptoms of sinusitis with the ear ache, sore throat, low-grade fever, sinus pressure, and facial pain.  Augmentin 875-125 mg tablet twice daily by mouth.  Tylenol for headache and fever, follow-up with unresolved symptoms.  Rx sent to pharmacy.

## 2021-07-28 ENCOUNTER — Other Ambulatory Visit: Payer: Self-pay | Admitting: Nurse Practitioner

## 2021-07-28 DIAGNOSIS — J301 Allergic rhinitis due to pollen: Secondary | ICD-10-CM

## 2021-08-08 ENCOUNTER — Telehealth: Payer: Managed Care, Other (non HMO) | Admitting: Physician Assistant

## 2021-08-08 DIAGNOSIS — M545 Low back pain, unspecified: Secondary | ICD-10-CM | POA: Diagnosis not present

## 2021-08-09 MED ORDER — NAPROXEN 500 MG PO TABS: 500.0000 mg | ORAL_TABLET | Freq: Two times a day (BID) | ORAL | 0 refills | Status: DC

## 2021-08-09 MED ORDER — CYCLOBENZAPRINE HCL 10 MG PO TABS: 10.0000 mg | ORAL_TABLET | Freq: Three times a day (TID) | ORAL | 0 refills | Status: DC | PRN

## 2021-08-09 NOTE — Progress Notes (Signed)
I have spent 5 minutes in review of e-visit questionnaire, review and updating patient chart, medical decision making and response to patient.   Schwanda Zima Cody Anyla Israelson, PA-C    

## 2021-08-09 NOTE — Progress Notes (Signed)

## 2021-08-22 ENCOUNTER — Telehealth: Payer: Managed Care, Other (non HMO) | Admitting: Physician Assistant

## 2021-08-22 DIAGNOSIS — Z20822 Contact with and (suspected) exposure to covid-19: Secondary | ICD-10-CM | POA: Diagnosis not present

## 2021-08-22 MED ORDER — BENZONATATE 100 MG PO CAPS: 100.0000 mg | ORAL_CAPSULE | Freq: Three times a day (TID) | ORAL | 0 refills | Status: DC | PRN

## 2021-08-22 MED ORDER — LIDOCAINE VISCOUS HCL 2 % MT SOLN: 5.0000 mL | OROMUCOSAL | 0 refills | Status: DC | PRN

## 2021-08-22 NOTE — Progress Notes (Signed)
E-Visit for Corona Virus Screening  Your current symptoms could be consistent with the coronavirus.  Many health care providers can now test patients at their office but not all are.  [facility] has multiple testing sites. For information on our COVID testing locations and hours go to [email or web address]  We are enrolling you in our MyChart Home Monitoring for COVID19 . Daily you will receive a questionnaire within the MyChart website. Our COVID 19 response team will be monitoring your responses daily.  Testing Information: The COVID-19 Community Testing sites are testing BY APPOINTMENT ONLY.  You can schedule online at [email or web address]  If you do not have access to a smart phone or computer you may call [phone] for an appointment.   Additional testing sites in the Community:  For [facility] Testing sites in [place]  [email or web address]  For Pop-up testing sites in [place]  [email or web address]  For [facility] [email or web address]  For [facility] testing in [place] and [employer] [email or web address]  For [facility] testing in [place]   [email or web address]  For  more information about community testing call [phone]   Please quarantine yourself while awaiting your test results. Please stay home for a minimum of 10 days from the first day of illness with improving symptoms and you have had 24 hours of no fever (without the use of Tylenol (Acetaminophen) Motrin (Ibuprofen) or any fever reducing medication).  Also - Do not get tested prior to returning to work because once you have had a positive test the test can stay positive for  more than a month in some cases.   You should wear a mask or cloth face covering over your nose and mouth if you must be around other people or animals, including pets (even at home). Try to stay at least 6 feet away from other people. This will protect the people around you.  Please continue good preventive care measures, including:  frequent hand-washing, avoid touching your face, cover coughs/sneezes, stay out of crowds and keep a 6 foot distance from others.  COVID-19 is a respiratory illness with symptoms that are similar to the flu. Symptoms are typically mild to moderate, but there have been cases of severe illness and death due to the virus.   The following symptoms may appear 2-14 days after exposure: Fever Cough Shortness of breath or difficulty breathing Chills Repeated shaking with chills Muscle pain Headache Sore throat New loss of taste or smell Fatigue Congestion or runny nose Nausea or vomiting Diarrhea  Go to the nearest hospital ED for assessment if fever/cough/breathlessness are severe or illness seems like a threat to life.  It is vitally important that if you feel that you have an infection such as this virus or any other virus that you stay home and away from places where you may spread it to others.  You should avoid contact with people age 81 and older.   You can use medication such as prescription cough medication called Tessalon Perles 100 mg. You may take 1-2 capsules every 8 hours as needed for cough; Viscous Lidocaine for the sore throat  You may also take acetaminophen (Tylenol) as needed for fever.  Reduce your risk of any infection by using the same precautions used for avoiding the common cold or flu:  Wash your hands often with soap and warm water for at least 20 seconds.  If soap and water are not readily available, use an alcohol-based hand sanitizer with at least 60% alcohol.  If  coughing or sneezing, cover your mouth and nose by coughing or sneezing  into the elbow areas of your shirt or coat, into a tissue or into your sleeve (not your hands). Avoid shaking hands with others and consider head nods or verbal greetings only. Avoid touching your eyes, nose, or mouth with unwashed hands.  Avoid close contact with people who are sick. Avoid places or events with large numbers of people in one location, like concerts or sporting events. Carefully consider travel plans you have or are making. If you are planning any travel outside or inside the [place], visit the CDC's Travelers' Health webpage for the latest health notices. If you have some symptoms but not all symptoms, continue to monitor at home and seek medical attention if your symptoms worsen. If you are having a medical emergency, call 911.  HOME CARE Only take medications as instructed by your medical team. Drink plenty of fluids and get plenty of rest. A steam or ultrasonic humidifier can help if you have congestion.   GET HELP RIGHT AWAY IF YOU HAVE EMERGENCY WARNING SIGNS** FOR COVID-19. If you or someone is showing any of these signs seek emergency medical care immediately. Call 911 or proceed to your closest emergency facility if: You develop worsening high fever. Trouble breathing Bluish lips or face Persistent pain or pressure in the chest New confusion Inability to wake or stay awake You cough up blood. Your symptoms become more severe  **This list is not all possible symptoms. Contact your medical provider for any symptoms that are sever or concerning to you.  MAKE SURE YOU  Understand these instructions. Will watch your condition. Will get help right away if you are not doing well or get worse.  Your e-visit answers were reviewed by a board certified advanced clinical practitioner to complete your personal care plan.  Depending on the condition, your plan could have included both over the counter or prescription medications.  If there is a problem please reply once you  have received a response from your provider.  Your safety is important to [place].  If you have drug allergies check your prescription carefully.    You can use MyChart to ask questions about today's visit, request a non-urgent call back, or ask for a work or school excuse for 24 hours related to this e-Visit. If it has been greater than 24 hours you will need to follow up with your provider, or enter a new e-Visit to address those concerns. You will get an e-mail in the next two days asking about your experience.  I hope that your e-visit has been valuable and will speed your recovery. Thank you for using e-visits.  I provided 6 minutes of non face-to-face time during this encounter for chart review and documentation.

## 2021-08-23 ENCOUNTER — Telehealth: Payer: Self-pay | Admitting: Nurse Practitioner

## 2021-08-23 NOTE — Telephone Encounter (Signed)
Work note sent via MyChart and left message advising pt about work note and to call back with any further questions or concerns.

## 2021-08-23 NOTE — Telephone Encounter (Signed)
Ok to fix note

## 2021-08-25 ENCOUNTER — Ambulatory Visit (INDEPENDENT_AMBULATORY_CARE_PROVIDER_SITE_OTHER): Payer: 59 | Admitting: Family Medicine

## 2021-08-25 ENCOUNTER — Encounter: Payer: Self-pay | Admitting: Family Medicine

## 2021-08-25 ENCOUNTER — Telehealth: Payer: Self-pay | Admitting: Nurse Practitioner

## 2021-08-25 DIAGNOSIS — J069 Acute upper respiratory infection, unspecified: Secondary | ICD-10-CM | POA: Diagnosis not present

## 2021-08-25 MED ORDER — PREDNISONE 20 MG PO TABS: 20.0000 mg | ORAL_TABLET | Freq: Every day | ORAL | 0 refills | Status: AC

## 2021-08-25 NOTE — Progress Notes (Signed)
   Virtual Visit  Note Due to COVID-19 pandemic this visit was conducted virtually. This visit type was conducted due to national recommendations for restrictions regarding the COVID-19 Pandemic (e.g. social distancing, sheltering in place) in an effort to limit this patient's exposure and mitigate transmission in our community. All issues noted in this document were discussed and addressed.  A physical exam was not performed with this format.  I connected with Sean Gilbert on 08/25/21 at 1351 by telephone and verified that I am speaking with the correct person using two identifiers. Sean Gilbert is currently located at home and no one is currently with him during the visit. The provider, Gabriel Earing, FNP is located in their office at time of visit.  I discussed the limitations, risks, security and privacy concerns of performing an evaluation and management service by telephone and the availability of in person appointments. I also discussed with the patient that there may be a patient responsible charge related to this service. The patient expressed understanding and agreed to proceed.  CC: congestion  History and Present Illness:  HPI Sean Gilbert reports head congestion, dry cough, ear pressure, fatigue, and scratchy throat x 4 days. He has been taking mucinex and nyquil. His sympotms have improved slightly. He had a negative home Covid test 2 days ago. He denies fever, body aches, chills, shortness of breath, or chest pain.     ROS As per HPI.   Observations/Objective: Alert and oriented x 3. Able to speak in full sentences without difficulty.   Assessment and Plan: Sean Gilbert was seen today for nasal congestion.  Diagnoses and all orders for this visit:  Upper respiratory tract infection, unspecified type Continue symptomatic care for viral illness. Covid test pending, quarantine until results. Prednisone burst as below.  -     predniSONE (DELTASONE) 20 MG tablet;  Take 1 tablet (20 mg total) by mouth daily with breakfast for 5 days. -     Novel Coronavirus, NAA ([facility]); Future    Follow Up Instructions: Return to office for new or worsening symptoms, or if symptoms persist.      I discussed the assessment and treatment plan with the patient. The patient was provided an opportunity to ask questions and all were answered. The patient agreed with the plan and demonstrated an understanding of the instructions.   The patient was advised to call back or seek an in-person evaluation if the symptoms worsen or if the condition fails to improve as anticipated.  The above assessment and management plan was discussed with the patient. The patient verbalized understanding of and has agreed to the management plan. Patient is aware to call the clinic if symptoms persist or worsen. Patient is aware when to return to the clinic for a follow-up visit. Patient educated on when it is appropriate to go to the emergency department.   Time call ended:  1402  I provided 12 minutes of  non face-to-face time during this encounter.    Gabriel Earing, FNP

## 2021-08-25 NOTE — Telephone Encounter (Signed)
  Incoming Patient Call  08/25/2021  What symptoms do you have? Dry cough, loses voice a lot, head congestion, he is taking muccinex  How long have you been sick? Since sunday  Have you been seen for this problem? Yes televisit with Je, pt would like antibiotic to be prescribed since muccinex is not helping  If your provider decides to give you a prescription, which pharmacy would you like for it to be sent to? [facility]    Patient informed that this information will be sent to the clinical staff for review and that they should receive a follow up call.

## 2021-11-03 ENCOUNTER — Telehealth (INDEPENDENT_AMBULATORY_CARE_PROVIDER_SITE_OTHER): Payer: 59 | Admitting: Family Medicine

## 2021-11-03 NOTE — Progress Notes (Signed)
Called patient and tried to contact him via video as well and no answer

## 2021-12-14 ENCOUNTER — Ambulatory Visit (INDEPENDENT_AMBULATORY_CARE_PROVIDER_SITE_OTHER): Payer: 59 | Admitting: Nurse Practitioner

## 2021-12-14 ENCOUNTER — Encounter: Payer: Self-pay | Admitting: Nurse Practitioner

## 2021-12-14 DIAGNOSIS — J069 Acute upper respiratory infection, unspecified: Secondary | ICD-10-CM

## 2021-12-14 NOTE — Progress Notes (Signed)
Virtual Visit  Note Due to COVID-19 pandemic this visit was conducted virtually. This visit type was conducted due to national recommendations for restrictions regarding the COVID-19 Pandemic (e.g. social distancing, sheltering in place) in an effort to limit this patient's exposure and mitigate transmission in our community. All issues noted in this document were discussed and addressed.  A physical exam was not performed with this format.  I connected with Sean Gilbert on 12/14/21 at 9:04 by telephone and verified that I am speaking with the correct person using two identifiers. [name] is currently located at home and no one is currently with him during visit. The provider, Mary-Margaret Daphine Deutscher, FNP is located in their office at time of visit.  I discussed the limitations, risks, security and privacy concerns of performing an evaluation and management service by telephone and the availability of in person appointments. I also discussed with the patient that there may be a patient responsible charge related to this service. The patient expressed understanding and agreed to proceed.   History and Present Illness:  Patient states that he developed head congestion Saturday.  URI  This is a new problem. The current episode started in the past 7 days. The problem has been unchanged. There has been no fever. The fever has been present for 1 to 2 days. Associated symptoms include congestion, coughing (better today), ear pain, rhinorrhea and a sore throat (slight). Pertinent negatives include no sinus pain. He has tried decongestant for the symptoms. The treatment provided moderate relief.     Review of Systems  Constitutional:  Positive for malaise/fatigue. Negative for chills and fever.  HENT:  Positive for congestion, ear pain, rhinorrhea and sore throat (slight). Negative for sinus pain.   Respiratory:  Positive for cough (better today).   Musculoskeletal:  Negative for  myalgias.    Observations/Objective: Alert and oriented- answers all questions appropriately No distress Raspy voice No cough during visit  Assessment and Plan: Sean Gilbert in today with chief complaint of No chief complaint on file.   1. URI with cough and congestion 1. Take meds as prescribed 2. Use a cool mist humidifier especially during the winter months and when heat has been humid. 3. Use saline nose sprays frequently 4. Saline irrigations of the nose can be very helpful if done frequently.  * 4X daily for 1 week*  * Use of a nettie pot can be helpful with this. Follow directions with this* 5. Drink plenty of fluids 6. Keep thermostat turn down low 7.For any cough or congestion- mucinex 8. For fever or aces or pains- take tylenol or ibuprofen appropriate for age and weight.  * for fevers greater than 101 orally you may alternate ibuprofen and tylenol every  3 hours.     Follow Up Instructions: prn    I discussed the assessment and treatment plan with the patient. The patient was provided an opportunity to ask questions and all were answered. The patient agreed with the plan and demonstrated an understanding of the instructions.   The patient was advised to call back or seek an in-person evaluation if the symptoms worsen or if the condition fails to improve as anticipated.  The above assessment and management plan was discussed with the patient. The patient verbalized understanding of and has agreed to the management plan. Patient is aware to call the clinic if symptoms persist or worsen. Patient is aware when to return to the clinic for a follow-up visit. Patient educated  on when it is appropriate to go to the emergency department.   Time call ended:  9:15  I provided 11 minutes of  non face-to-face time during this encounter.    Mary-Margaret Daphine Deutscher, FNP

## 2021-12-14 NOTE — Patient Instructions (Signed)

## 2022-03-02 ENCOUNTER — Encounter: Payer: Self-pay | Admitting: Family Medicine

## 2022-03-02 ENCOUNTER — Telehealth (INDEPENDENT_AMBULATORY_CARE_PROVIDER_SITE_OTHER): Payer: 59 | Admitting: Family Medicine

## 2022-03-02 DIAGNOSIS — J0140 Acute pansinusitis, unspecified: Secondary | ICD-10-CM

## 2022-03-02 MED ORDER — FLUTICASONE PROPIONATE 50 MCG/ACT NA SUSP: 2.0000 | Freq: Every day | NASAL | 6 refills | Status: DC

## 2022-03-02 MED ORDER — GUAIFENESIN ER 600 MG PO TB12: 600.0000 mg | ORAL_TABLET | Freq: Two times a day (BID) | ORAL | 0 refills | Status: AC

## 2022-03-02 MED ORDER — AMOXICILLIN-POT CLAVULANATE 875-125 MG PO TABS: 1.0000 | ORAL_TABLET | Freq: Two times a day (BID) | ORAL | 0 refills | Status: AC

## 2022-03-02 NOTE — Progress Notes (Signed)
? ?Virtual Visit via telephone Note ?Due to COVID-19 pandemic this visit was conducted virtually. This visit type was conducted due to national recommendations for restrictions regarding the COVID-19 Pandemic (e.g. social distancing, sheltering in place) in an effort to limit this patient's exposure and mitigate transmission in our community. All issues noted in this document were discussed and addressed.  A physical exam was not performed with this format.  ? ?I connected with Sean Fantihristopher S Yett on 03/02/2022 at 1220 by telephone and verified that I am speaking with the correct person using two identifiers. Sean FantiChristopher S Cody is currently located at home and family is currently with them during visit. The provider, Kari BaarsMichelle Orlondo Holycross, FNP is located in their office at time of visit. ? ?I discussed the limitations, risks, security and privacy concerns of performing an evaluation and management service by virtual visit and the availability of in person appointments. I also discussed with the patient that there may be a patient responsible charge related to this service. The patient expressed understanding and agreed to proceed. ? ?Subjective:  ?Patient ID: Sean Gilbert, male    DOB: 1996/01/14, 26 y.o.   MRN: [id number] ? ?Chief Complaint:  Sinusitis ? ? ?HPI: ?Sean FantiChristopher S Cotham is a 26 y.o. male presenting on 03/02/2022 for Sinusitis ? ? ?Sinusitis ?This is a new problem. The current episode started 1 to 4 weeks ago. The problem has been gradually worsening since onset. His pain is at a severity of 6/10. Associated symptoms include chills, congestion, ear pain, headaches and sinus pressure. Pertinent negatives include no coughing, diaphoresis, hoarse voice, neck pain, shortness of breath, sneezing, sore throat or swollen glands. Past treatments include oral decongestants and acetaminophen. The treatment provided no relief.  ? ? ?Relevant past medical, surgical, family, and social history reviewed and  updated as indicated.  ?Allergies and medications reviewed and updated. ? ? ?Past Medical History:  ?Diagnosis Date  ? Allergy   ? ? ?Past Surgical History:  ?Procedure Laterality Date  ? TONSILLECTOMY    ? tubes in ears    ? ? ?Social History  ? ?Socioeconomic History  ? Marital status: Single  ?  Spouse name: Not on file  ? Number of children: Not on file  ? Years of education: Not on file  ? Highest education level: Not on file  ?Occupational History  ? Not on file  ?Tobacco Use  ? Smoking status: Never  ? Smokeless tobacco: Never  ?Vaping Use  ? Vaping Use: Never used  ?Substance and Sexual Activity  ? Alcohol use: No  ? Drug use: No  ? Sexual activity: Not on file  ?Other Topics Concern  ? Not on file  ?Social History Narrative  ? Not on file  ? ?Social Determinants of Health  ? ?Financial Resource Strain: Not on file  ?Food Insecurity: Not on file  ?Transportation Needs: Not on file  ?Physical Activity: Not on file  ?Stress: Not on file  ?Social Connections: Not on file  ?Intimate Partner Violence: Not on file  ? ? ?Outpatient Encounter Medications as of 03/02/2022  ?Medication Sig  ? amoxicillin-clavulanate (AUGMENTIN) 875-125 MG tablet Take 1 tablet by mouth 2 (two) times daily for 10 days.  ? fluticasone (FLONASE) 50 MCG/ACT nasal spray Place 2 sprays into both nostrils daily.  ? guaiFENesin (MUCINEX) 600 MG 12 hr tablet Take 1 tablet (600 mg total) by mouth 2 (two) times daily for 10 days.  ? benzonatate (TESSALON) 100 MG capsule Take 1 capsule (  100 mg total) by mouth 3 (three) times daily as needed.  ? cetirizine (ZYRTEC) 10 MG tablet TAKE 1 TABLET BY MOUTH EVERY DAY  ? cyclobenzaprine (FLEXERIL) 10 MG tablet Take 1 tablet (10 mg total) by mouth 3 (three) times daily as needed for muscle spasms.  ? diphenhydrAMINE (BENADRYL) 25 MG tablet Take 1 tablet (25 mg total) by mouth every 6 (six) hours as needed for itching.  ? ibuprofen (ADVIL) 600 MG tablet Take 1 tablet (600 mg total) by mouth every 8 (eight)  hours as needed.  ? lidocaine (XYLOCAINE) 2 % solution Use as directed 5 mLs in the mouth or throat every 4 (four) hours as needed for mouth pain. Swish and gargle in mouth x 15-30 seconds then swallow  ? naproxen (NAPROSYN) 500 MG tablet Take 1 tablet (500 mg total) by mouth 2 (two) times daily with a meal.  ? pantoprazole (PROTONIX) 20 MG tablet Take 1 tablet (20 mg total) by mouth daily.  ? [DISCONTINUED] amoxicillin-clavulanate (AUGMENTIN) 875-125 MG tablet Take 1 tablet by mouth 2 (two) times daily.  ? [DISCONTINUED] fluticasone (FLONASE) 50 MCG/ACT nasal spray Place 2 sprays into both nostrils daily.  ? ?No facility-administered encounter medications on file as of 03/02/2022.  ? ? ?No Known Allergies ? ?Review of Systems  ?Constitutional:  Positive for appetite change, chills, fatigue and fever. Negative for activity change, diaphoresis and unexpected weight change.  ?HENT:  Positive for congestion, ear pain, postnasal drip, rhinorrhea, sinus pressure and sinus pain. Negative for dental problem, drooling, ear discharge, facial swelling, hoarse voice, mouth sores, nosebleeds, sneezing, sore throat, tinnitus, trouble swallowing and voice change.   ?Respiratory:  Negative for cough and shortness of breath.   ?Cardiovascular:  Negative for chest pain, palpitations and leg swelling.  ?Gastrointestinal:  Negative for abdominal pain.  ?Genitourinary:  Negative for decreased urine volume and difficulty urinating.  ?Musculoskeletal:  Negative for myalgias and neck pain.  ?Neurological:  Positive for headaches. Negative for dizziness, tremors, seizures, syncope, facial asymmetry, speech difficulty, weakness, light-headedness and numbness.  ?Psychiatric/Behavioral:  Negative for confusion.   ?All other systems reviewed and are negative. ? ?   ? ? ?Observations/Objective: ?No vital signs or physical exam, this was a virtual health encounter.  ?Pt alert and oriented, answers all questions appropriately, and able to speak in  full sentences.  ? ? ?Assessment and Plan: ?Jveon was seen today for sinusitis. ? ?Diagnoses and all orders for this visit: ? ?Acute non-recurrent pansinusitis ?Ongoing and worsening symptoms. Has failed symptomatic care at home with DayQuil and NyQuil. Will initiate below. Symptomatic care discussed in detail. Complete full course of antibiotic therapy. Report any new, worsening, or persistent symptoms. Follow up as needed.  ?-     guaiFENesin (MUCINEX) 600 MG 12 hr tablet; Take 1 tablet (600 mg total) by mouth 2 (two) times daily for 10 days. ?-     fluticasone (FLONASE) 50 MCG/ACT nasal spray; Place 2 sprays into both nostrils daily. ?-     amoxicillin-clavulanate (AUGMENTIN) 875-125 MG tablet; Take 1 tablet by mouth 2 (two) times daily for 10 days. ? ? ? ? ?Follow Up Instructions: ?Return if symptoms worsen or fail to improve. ? ?  ?I discussed the assessment and treatment plan with the patient. The patient was provided an opportunity to ask questions and all were answered. The patient agreed with the plan and demonstrated an understanding of the instructions. ?  ?The patient was advised to call back or seek an in-person  evaluation if the symptoms worsen or if the condition fails to improve as anticipated. ? ?The above assessment and management plan was discussed with the patient. The patient verbalized understanding of and has agreed to the management plan. Patient is aware to call the clinic if they develop any new symptoms or if symptoms persist or worsen. Patient is aware when to return to the clinic for a follow-up visit. Patient educated on when it is appropriate to go to the emergency department.  ? ? ?I provided 15 minutes of time during this telephone encounter. ? ? ?Kari Baars, FNP-C ?[facility] ?[address] ?[place], [place] [address] ?[phone] ?03/02/2022 ? ? ?

## 2022-03-03 ENCOUNTER — Telehealth: Payer: Self-pay | Admitting: Nurse Practitioner

## 2022-03-03 NOTE — Telephone Encounter (Signed)
Note written and sent via MyChart and pt is aware. ?

## 2022-03-03 NOTE — Telephone Encounter (Signed)
Had a visit with Rakes yesterday- please advise on note and send back to pools.  ?

## 2022-03-03 NOTE — Telephone Encounter (Signed)
Can provide note.  ?

## 2022-03-07 ENCOUNTER — Telehealth: Payer: Self-pay | Admitting: Nurse Practitioner

## 2022-03-07 ENCOUNTER — Other Ambulatory Visit: Payer: Self-pay | Admitting: Family Medicine

## 2022-03-07 DIAGNOSIS — J069 Acute upper respiratory infection, unspecified: Secondary | ICD-10-CM

## 2022-03-07 DIAGNOSIS — K2100 Gastro-esophageal reflux disease with esophagitis, without bleeding: Secondary | ICD-10-CM

## 2022-03-07 MED ORDER — PANTOPRAZOLE SODIUM 20 MG PO TBEC: 20.0000 mg | DELAYED_RELEASE_TABLET | Freq: Every day | ORAL | 0 refills | Status: DC

## 2022-03-07 NOTE — Telephone Encounter (Signed)
[facility], patient aware ?

## 2022-03-30 ENCOUNTER — Other Ambulatory Visit: Payer: Self-pay | Admitting: Nurse Practitioner

## 2022-03-30 DIAGNOSIS — K2100 Gastro-esophageal reflux disease with esophagitis, without bleeding: Secondary | ICD-10-CM

## 2022-04-05 ENCOUNTER — Ambulatory Visit (INDEPENDENT_AMBULATORY_CARE_PROVIDER_SITE_OTHER): Payer: 59 | Admitting: Nurse Practitioner

## 2022-04-05 ENCOUNTER — Encounter: Payer: Self-pay | Admitting: Nurse Practitioner

## 2022-04-05 DIAGNOSIS — K2100 Gastro-esophageal reflux disease with esophagitis, without bleeding: Secondary | ICD-10-CM

## 2022-04-05 DIAGNOSIS — G4700 Insomnia, unspecified: Secondary | ICD-10-CM | POA: Diagnosis not present

## 2022-04-05 DIAGNOSIS — J0140 Acute pansinusitis, unspecified: Secondary | ICD-10-CM

## 2022-04-05 DIAGNOSIS — J4520 Mild intermittent asthma, uncomplicated: Secondary | ICD-10-CM | POA: Diagnosis not present

## 2022-04-05 MED ORDER — FLUTICASONE PROPIONATE 50 MCG/ACT NA SUSP: 2.0000 | Freq: Every day | NASAL | 1 refills | Status: DC

## 2022-04-05 MED ORDER — PANTOPRAZOLE SODIUM 20 MG PO TBEC: 20.0000 mg | DELAYED_RELEASE_TABLET | Freq: Every day | ORAL | 1 refills | Status: DC

## 2022-04-05 NOTE — Patient Instructions (Signed)

## 2022-04-05 NOTE — Progress Notes (Signed)
? ?Virtual Visit  Note ?Due to COVID-19 pandemic this visit was conducted virtually. This visit type was conducted due to national recommendations for restrictions regarding the COVID-19 Pandemic (e.g. social distancing, sheltering in place) in an effort to limit this patient's exposure and mitigate transmission in our community. All issues noted in this document were discussed and addressed.  A physical exam was not performed with this format. ? ?I connected with Sean Gilbert on 04/05/22 at 12:14 by telephone and verified that I am speaking with the correct person using two identifiers. Sean Gilbert is currently located at work and no one is currently with him during visit. The provider, Mary-Margaret Daphine Deutscher, FNP is located in their office at time of visit. ? ?I discussed the limitations, risks, security and privacy concerns of performing an evaluation and management service by telephone and the availability of in person appointments. I also discussed with the patient that there may be a patient responsible charge related to this service. The patient expressed understanding and agreed to proceed. ? ? ?History and Present Illness: ? ?Chief Complaint: No chief complaint on file. ?  ? ?HPI: ? ?Sean Gilbert is a 26 y.o. who identifies as a male who was assigned male at birth.  ? ?Social history: ?Lives with: girlfriend ?Work history: [employer] ? ? ?Comes in today for follow up of the following chronic medical issues: ? ?1. Gastroesophageal reflux disease with esophagitis without hemorrhage ?On protinix daily and is doing well. He gets symptoms of reflux if he does not take his meds.  ? ?2. Insomnia, unspecified type ?Doing better. Sleeps about 8-9 hours ? ?3. Mild intermittent asthma, unspecified whether complicated ?Is on flonase  and zyrtec when he needs it. ? ? ?New complaints: ?None today ? ?No Known Allergies ?Outpatient Encounter Medications as of 04/05/2022  ?Medication Sig  ??  cetirizine (ZYRTEC) 10 MG tablet TAKE 1 TABLET BY MOUTH EVERY DAY  ?? cyclobenzaprine (FLEXERIL) 10 MG tablet Take 1 tablet (10 mg total) by mouth 3 (three) times daily as needed for muscle spasms.  ?? diphenhydrAMINE (BENADRYL) 25 MG tablet Take 1 tablet (25 mg total) by mouth every 6 (six) hours as needed for itching.  ?? fluticasone (FLONASE) 50 MCG/ACT nasal spray Place 2 sprays into both nostrils daily.  ?? ibuprofen (ADVIL) 600 MG tablet Take 1 tablet (600 mg total) by mouth every 8 (eight) hours as needed.  ?? naproxen (NAPROSYN) 500 MG tablet Take 1 tablet (500 mg total) by mouth 2 (two) times daily with a meal.  ?? pantoprazole (PROTONIX) 20 MG tablet Take 1 tablet (20 mg total) by mouth daily.  ?? [DISCONTINUED] benzonatate (TESSALON) 100 MG capsule Take 1 capsule (100 mg total) by mouth 3 (three) times daily as needed.  ?? [DISCONTINUED] lidocaine (XYLOCAINE) 2 % solution Use as directed 5 mLs in the mouth or throat every 4 (four) hours as needed for mouth pain. Swish and gargle in mouth x 15-30 seconds then swallow  ? ?No facility-administered encounter medications on file as of 04/05/2022.  ? ? ?Past Surgical History:  ?Procedure Laterality Date  ?? TONSILLECTOMY    ?? tubes in ears    ? ? ?History reviewed. No pertinent family history. ? ? ? ?Controlled substance contract: n/a ? ? ? ? ?Review of Systems  ?Constitutional:  Negative for diaphoresis and weight loss.  ?Eyes:  Negative for blurred vision, double vision and pain.  ?Respiratory:  Negative for shortness of breath.   ?Cardiovascular:  Negative for chest pain, palpitations, orthopnea and leg swelling.  ?Gastrointestinal:  Positive for heartburn. Negative for abdominal pain.  ?Skin:  Negative for rash.  ?Neurological:  Negative for dizziness, sensory change, loss of consciousness, weakness and headaches.  ?Endo/Heme/Allergies:  Negative for polydipsia. Does not bruise/bleed easily.  ?Psychiatric/Behavioral:  Negative for memory loss. The patient  does not have insomnia.   ?All other systems reviewed and are negative. ? ? ?Observations/Objective: ?Alert and oriented- answers all questions appropriately ?No distress ? ? ?Assessment and Plan: ?Sean Gilbert in today with chief complaint of No chief complaint on file. ? ? ?1. Gastroesophageal reflux disease with esophagitis without hemorrhage ?Av- pantoprazole (PROTONIX) 20 MG tablet; Take 1 tablet (20 mg total) by mouth daily.  Dispense: 90 tablet; Refill: 1 ?oid spicy foods ?Do not eat 2 hours prior to bedtime ? ? ?2. Insomnia, unspecified type ?Bedtime routine ? ?3. Mild intermittent asthma, unspecified whether complicated ?Continue zyrtec as prescribed ?- fluticasone (FLONASE) 50 MCG/ACT nasal spray; Place 2 sprays into both nostrils daily.  Dispense: 48 g; Refill: 1 ? ? ? ? ?Follow Up Instructions: ?prn ? ?  ?I discussed the assessment and treatment plan with the patient. The patient was provided an opportunity to ask questions and all were answered. The patient agreed with the plan and demonstrated an understanding of the instructions. ?  ?The patient was advised to call back or seek an in-person evaluation if the symptoms worsen or if the condition fails to improve as anticipated. ? ?The above assessment and management plan was discussed with the patient. The patient verbalized understanding of and has agreed to the management plan. Patient is aware to call the clinic if symptoms persist or worsen. Patient is aware when to return to the clinic for a follow-up visit. Patient educated on when it is appropriate to go to the emergency department.  ? ?Time call ended:  12:25 ? ?I provided 11 minutes of  non face-to-face time during this encounter. ? ? ? ?Mary-Margaret Daphine Deutscher, FNP ? ? ?

## 2022-05-10 ENCOUNTER — Telehealth: Payer: Self-pay | Admitting: Nurse Practitioner

## 2022-05-10 DIAGNOSIS — J4520 Mild intermittent asthma, uncomplicated: Secondary | ICD-10-CM

## 2022-05-10 DIAGNOSIS — K2100 Gastro-esophageal reflux disease with esophagitis, without bleeding: Secondary | ICD-10-CM

## 2022-05-11 MED ORDER — PANTOPRAZOLE SODIUM 20 MG PO TBEC: 20.0000 mg | DELAYED_RELEASE_TABLET | Freq: Every day | ORAL | 1 refills | Status: DC

## 2022-05-11 MED ORDER — FLUTICASONE PROPIONATE 50 MCG/ACT NA SUSP: 2.0000 | Freq: Every day | NASAL | 1 refills | Status: DC

## 2022-05-11 NOTE — Telephone Encounter (Signed)
Pt aware I sent the 2 medications I that were refilled last month to [facility] for him since he made [place] aware that he has to make the change.

## 2022-07-29 IMAGING — DX DG ABDOMEN 2V
4 series · 4 of 4 positions shown · non-contrast
Comparison: None.

CLINICAL DATA: Right lower abdominal pain.

EXAM:
ABDOMEN - 2 VIEW

[abdomen erect]
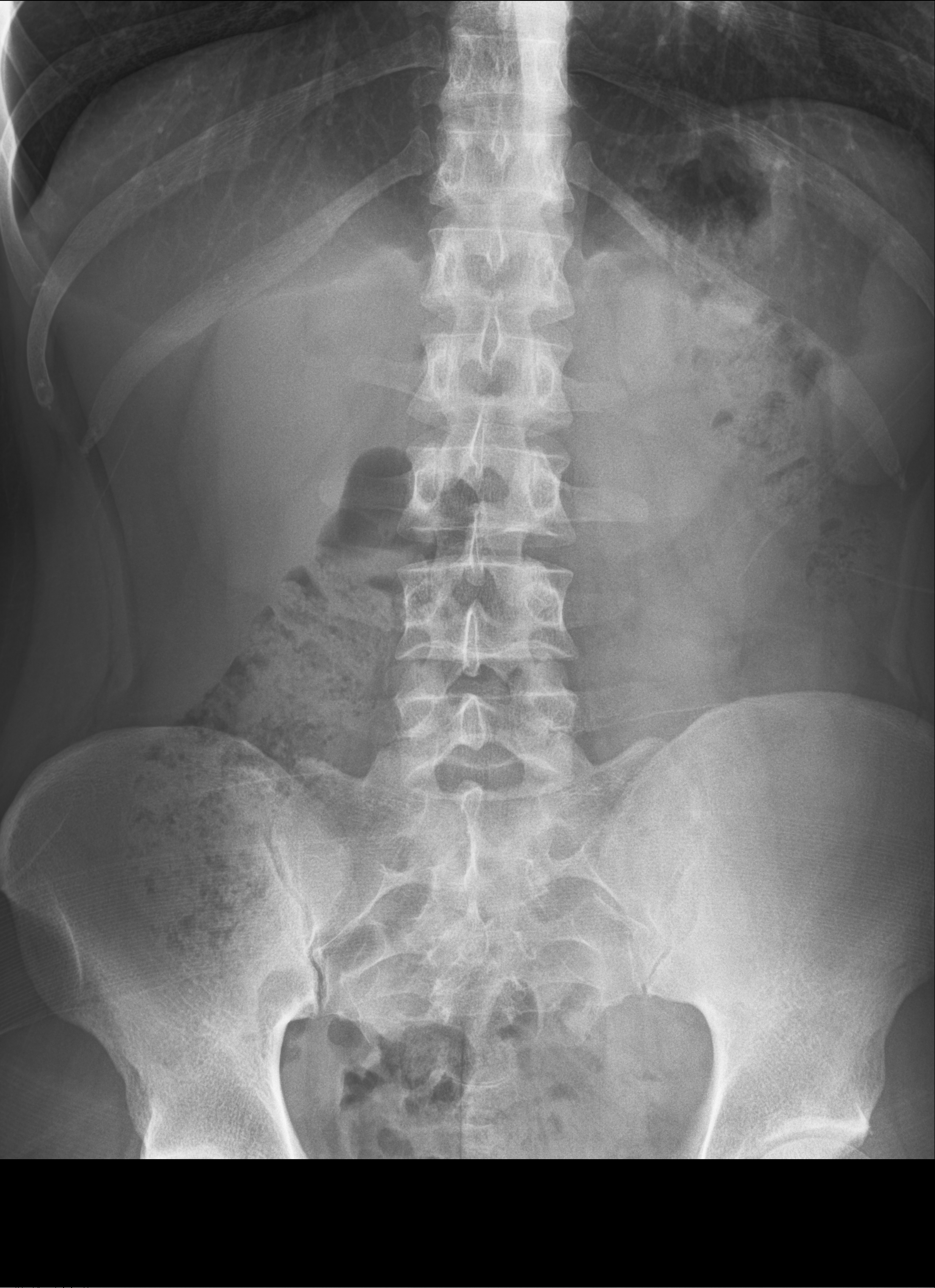

[abdomen supine (1 of 3)]
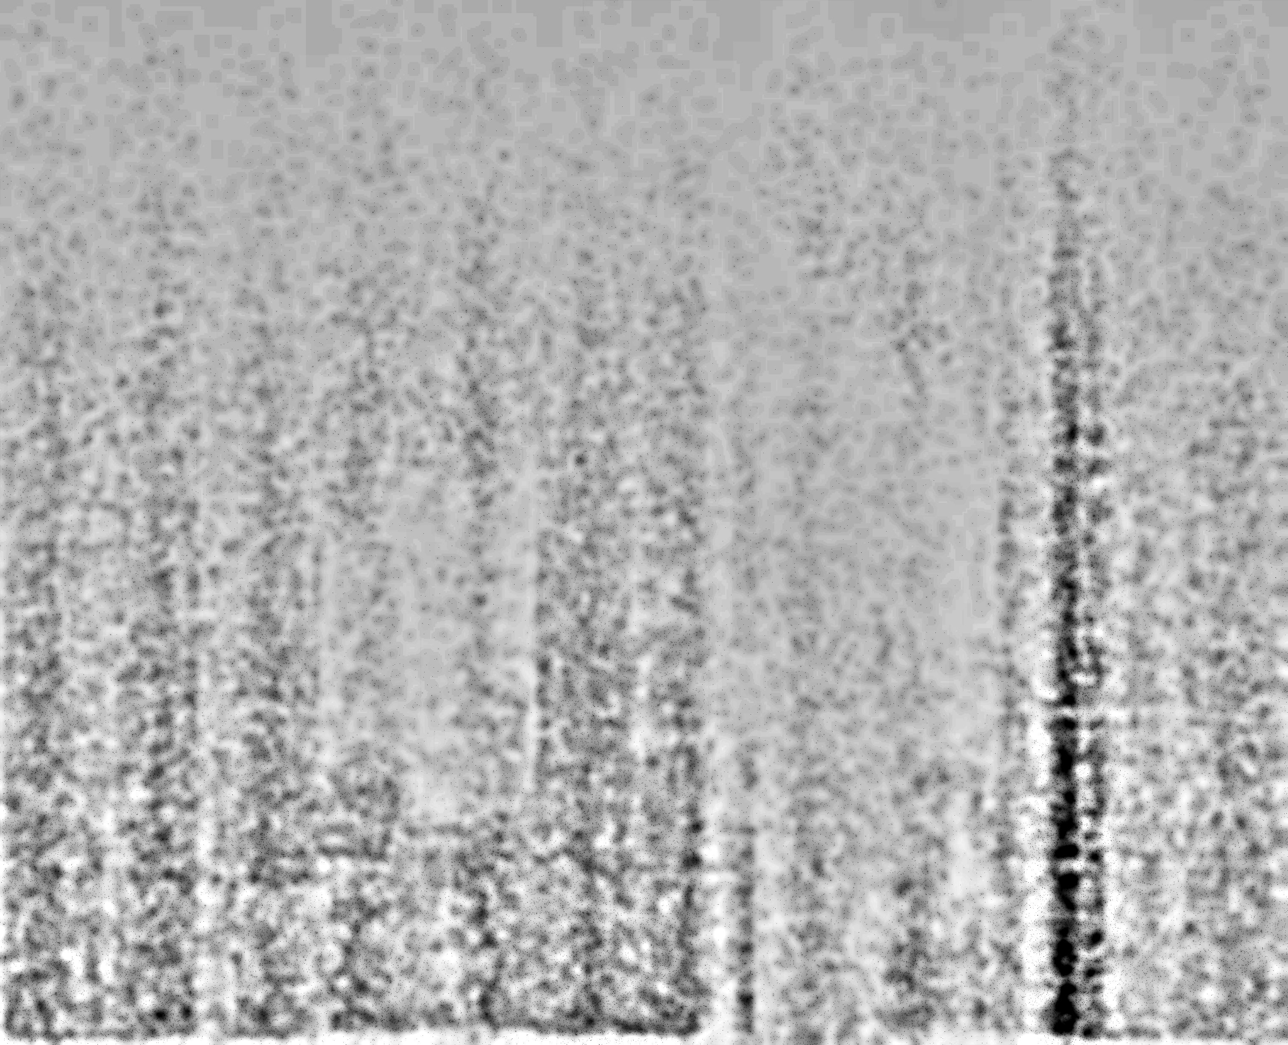

[abdomen supine (2 of 3)]
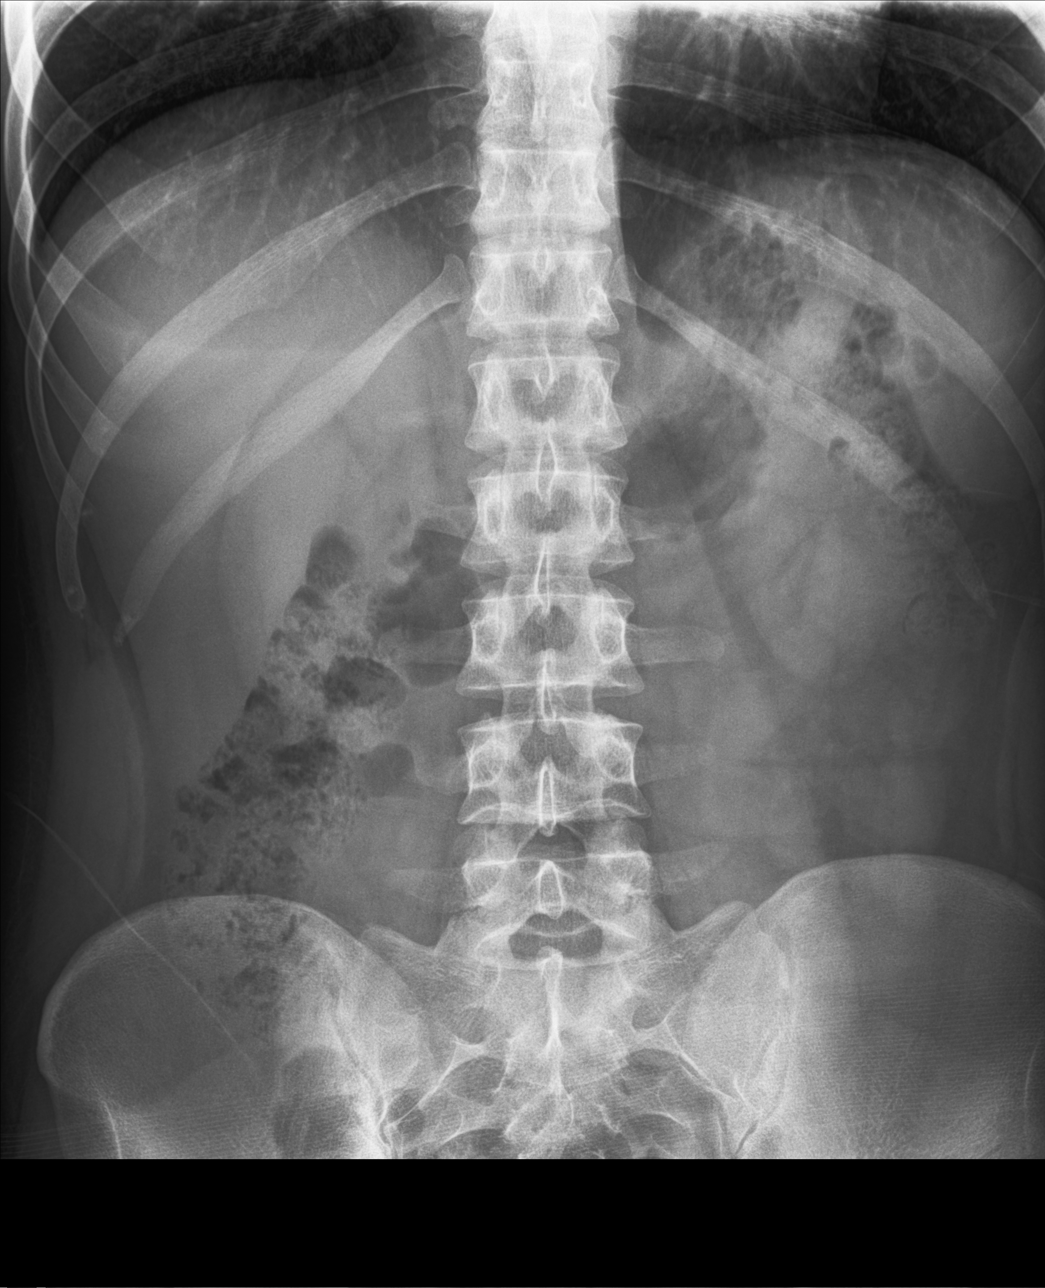

[abdomen supine (3 of 3)]
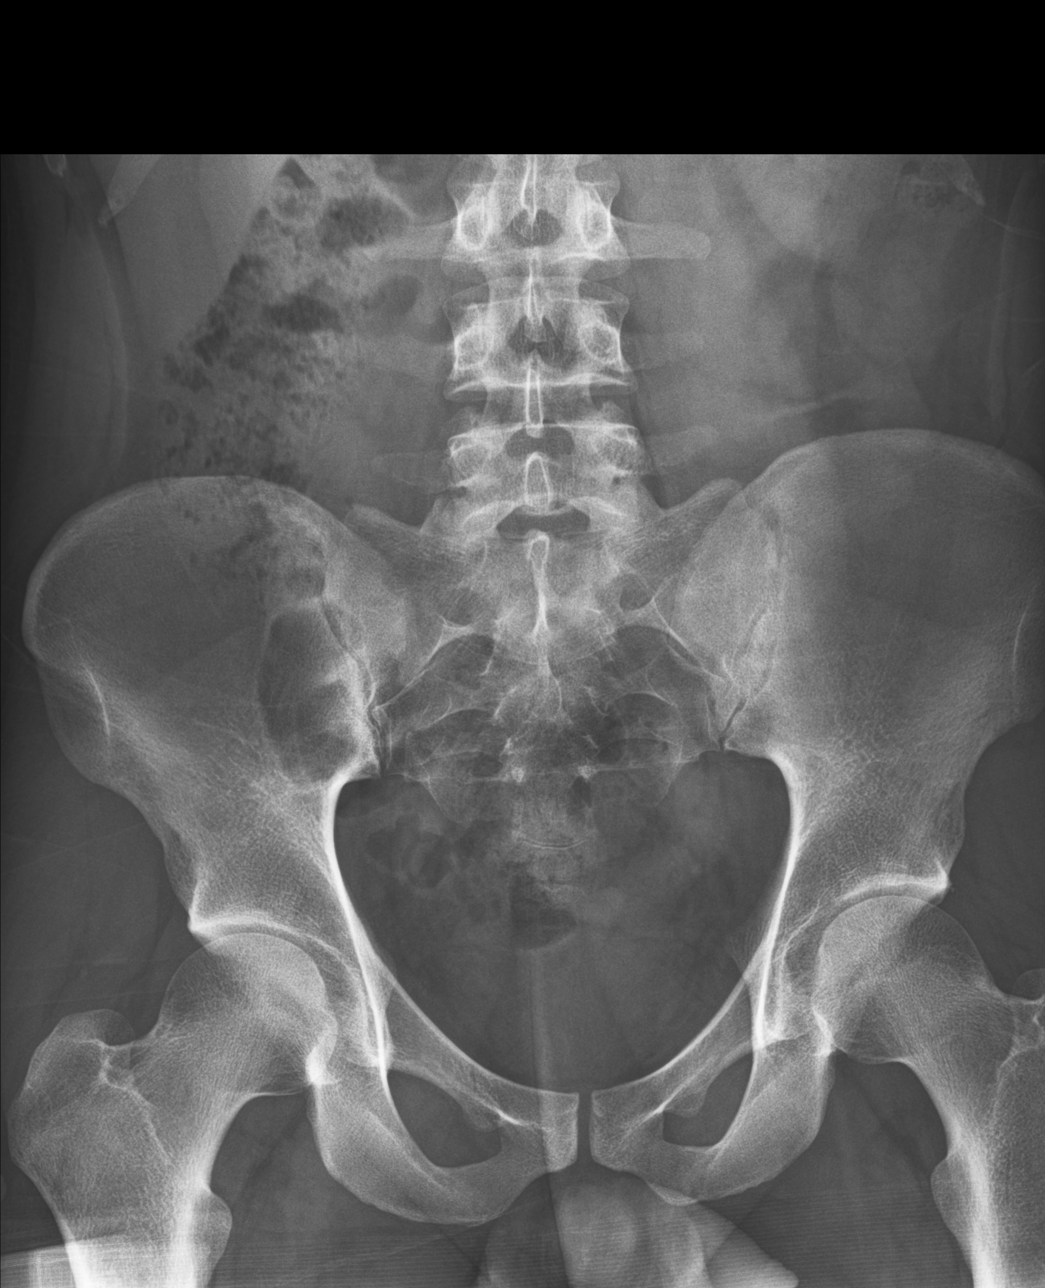

[4 of 4 positions shown; findings below may reference images not displayed]

FINDINGS: No free intra-abdominal air. No bowel dilatation to suggest
obstruction. Moderate stool in the right colon with small volume of
stool distally. No bowel air-fluid levels. No radiopaque calculi or
abnormal soft tissue calcifications. No concerning intraabdominal
mass effect. No osseous abnormalities are seen.
IMPRESSION: Unremarkable radiographs of the abdomen.  No explanation for pain.

## 2022-11-05 ENCOUNTER — Telehealth: Payer: Self-pay | Admitting: Nurse Practitioner

## 2022-11-05 DIAGNOSIS — K2100 Gastro-esophageal reflux disease with esophagitis, without bleeding: Secondary | ICD-10-CM

## 2022-11-06 ENCOUNTER — Encounter: Payer: Self-pay | Admitting: Nurse Practitioner

## 2022-11-06 NOTE — Telephone Encounter (Signed)
MMM NTBS for 6 mos ckup mail order NOT sent

## 2022-11-06 NOTE — Telephone Encounter (Signed)
Lmtcb. Letter mailed 

## 2022-11-20 ENCOUNTER — Encounter: Payer: Self-pay | Admitting: Nurse Practitioner

## 2022-11-20 ENCOUNTER — Ambulatory Visit (INDEPENDENT_AMBULATORY_CARE_PROVIDER_SITE_OTHER): Payer: 59 | Admitting: Nurse Practitioner

## 2022-11-20 DIAGNOSIS — J069 Acute upper respiratory infection, unspecified: Secondary | ICD-10-CM | POA: Diagnosis not present

## 2022-11-20 MED ORDER — PREDNISONE 20 MG PO TABS: 40.0000 mg | ORAL_TABLET | Freq: Every day | ORAL | 0 refills | Status: AC

## 2022-11-20 NOTE — Patient Instructions (Signed)

## 2022-11-20 NOTE — Progress Notes (Signed)
Virtual Visit  Note Due to COVID-19 pandemic this visit was conducted virtually. This visit type was conducted due to national recommendations for restrictions regarding the COVID-19 Pandemic (e.g. social distancing, sheltering in place) in an effort to limit this patient's exposure and mitigate transmission in our community. All issues noted in this document were discussed and addressed.  A physical exam was not performed with this format.  I connected with [name] on 11/20/22 at 11:00 by telephone and verified that I am speaking with the correct person using two identifiers. Sean Gilbert is currently located at home and no one is currently with him during visit. The provider, Mary-Margaret Daphine Deutscher, FNP is located in their office at time of visit.  I discussed the limitations, risks, security and privacy concerns of performing an evaluation and management service by telephone and the availability of in person appointments. I also discussed with the patient that there may be a patient responsible charge related to this service. The patient expressed understanding and agreed to proceed.   History and Present Illness:  URI  This is a new problem. The current episode started 1 to 4 weeks ago. The problem has been gradually improving. There has been no fever. Associated symptoms include congestion, coughing and rhinorrhea. Pertinent negatives include no headaches or sore throat. Treatments tried: dayquil. The treatment provided mild relief.      Review of Systems  HENT:  Positive for congestion and rhinorrhea. Negative for sore throat.   Respiratory:  Positive for cough.   Neurological:  Negative for headaches.     Observations/Objective:  Alert and oriented- answers all questions appropriately No distress No cough duriig visit  Assessment and Plan: COLEMAN KALAS in today with chief complaint of URI   1. URI with cough and congestion 1. Take meds as  prescribed 2. Use a cool mist humidifier especially during the winter months and when heat has been humid. 3. Use saline nose sprays frequently 4. Saline irrigations of the nose can be very helpful if done frequently.  * 4X daily for 1 week*  * Use of a nettie pot can be helpful with this. Follow directions with this* 5. Drink plenty of fluids 6. Keep thermostat turn down low 7.For any cough or congestion- mucinex 8. For fever or aces or pains- take tylenol or ibuprofen appropriate for age and weight.  * for fevers greater than 101 orally you may alternate ibuprofen and tylenol every  3 hours.    Meds ordered this encounter  Medications   predniSONE (DELTASONE) 20 MG tablet    Sig: Take 2 tablets (40 mg total) by mouth daily with breakfast for 5 days. 2 po daily for 5 days    Dispense:  10 tablet    Refill:  0    Order Specific Question:   Supervising Provider    Answer:   Arville Care A [[id number]]    Follow Up Instructions: prn    I discussed the assessment and treatment plan with the patient. The patient was provided an opportunity to ask questions and all were answered. The patient agreed with the plan and demonstrated an understanding of the instructions.   The patient was advised to call back or seek an in-person evaluation if the symptoms worsen or if the condition fails to improve as anticipated.  The above assessment and management plan was discussed with the patient. The patient verbalized understanding of and has agreed to the management plan. Patient is aware to  call the clinic if symptoms persist or worsen. Patient is aware when to return to the clinic for a follow-up visit. Patient educated on when it is appropriate to go to the emergency department.   Time call ended:  11:09  I provided 13 minutes of  non face-to-face time during this encounter.    Mary-Margaret Daphine Deutscher, FNP

## 2022-12-04 MED ORDER — PANTOPRAZOLE SODIUM 20 MG PO TBEC: 20.0000 mg | DELAYED_RELEASE_TABLET | Freq: Every day | ORAL | 0 refills | Status: DC

## 2022-12-04 NOTE — Telephone Encounter (Signed)
LMOVM refill sent to mail order pharmacy, if this needs to go to local pharmacy, please let us know.

## 2022-12-04 NOTE — Telephone Encounter (Signed)
Pt scheduled for 12/28/2022 at 10:30. Can pt get a refill till apt?

## 2022-12-05 ENCOUNTER — Other Ambulatory Visit: Payer: Self-pay

## 2022-12-05 ENCOUNTER — Telehealth: Payer: Self-pay | Admitting: Nurse Practitioner

## 2022-12-05 DIAGNOSIS — J4520 Mild intermittent asthma, uncomplicated: Secondary | ICD-10-CM

## 2022-12-05 DIAGNOSIS — K2100 Gastro-esophageal reflux disease with esophagitis, without bleeding: Secondary | ICD-10-CM

## 2022-12-05 MED ORDER — FLUTICASONE PROPIONATE 50 MCG/ACT NA SUSP: 2.0000 | Freq: Every day | NASAL | 1 refills | Status: DC

## 2022-12-05 MED ORDER — PANTOPRAZOLE SODIUM 20 MG PO TBEC: 20.0000 mg | DELAYED_RELEASE_TABLET | Freq: Every day | ORAL | 0 refills | Status: DC

## 2022-12-05 NOTE — Telephone Encounter (Signed)
Pt wants his Rx's sent to [facility] in [place] because he is having issues getting in touch with the mail order pharmacy.

## 2022-12-05 NOTE — Telephone Encounter (Signed)
Meds sent to [facility]

## 2022-12-28 ENCOUNTER — Ambulatory Visit: Payer: 59 | Admitting: Nurse Practitioner

## 2022-12-29 ENCOUNTER — Ambulatory Visit (INDEPENDENT_AMBULATORY_CARE_PROVIDER_SITE_OTHER): Payer: 59 | Admitting: Nurse Practitioner

## 2022-12-29 ENCOUNTER — Encounter: Payer: Self-pay | Admitting: Nurse Practitioner

## 2022-12-29 VITALS — BP 125/81 | HR 77 | Temp 97.6°F | Resp 20 | Ht 68.0 in | Wt 159.0 lb

## 2022-12-29 DIAGNOSIS — K2100 Gastro-esophageal reflux disease with esophagitis, without bleeding: Secondary | ICD-10-CM | POA: Diagnosis not present

## 2022-12-29 MED ORDER — PANTOPRAZOLE SODIUM 20 MG PO TBEC: 20.0000 mg | DELAYED_RELEASE_TABLET | Freq: Every day | ORAL | 3 refills | Status: DC

## 2022-12-29 NOTE — Patient Instructions (Signed)

## 2022-12-29 NOTE — Progress Notes (Signed)
Subjective:    Patient ID: CHESNEY KLIMASZEWSKI, male    DOB: 07-09-1996, 27 y.o.   MRN: [id number]   Chief Complaint: No chief complaint on file.    HPI:  DUGLAS HEIER is a 27 y.o. who identifies as a male who was assigned male at birth.   Social history: Lives with: byhimself Work history: Public house manager   Comes in today for follow up of the following chronic medical issues:  1. Gastroesophageal reflux disease with esophagitis without hemorrhage I son protonix which works great for him   New complaints: None  today  No Known Allergies Outpatient Encounter Medications as of 12/29/2022  Medication Sig   cetirizine (ZYRTEC) 10 MG tablet TAKE 1 TABLET BY MOUTH EVERY DAY   fluticasone (FLONASE) 50 MCG/ACT nasal spray Place 2 sprays into both nostrils daily.   pantoprazole (PROTONIX) 20 MG tablet Take 1 tablet (20 mg total) by mouth daily.   No facility-administered encounter medications on file as of 12/29/2022.    Past Surgical History:  Procedure Laterality Date   TONSILLECTOMY     tubes in ears      No family history on file.    Controlled substance contract: n/a     Review of Systems  Constitutional:  Negative for diaphoresis.  Eyes:  Negative for pain.  Respiratory:  Negative for shortness of breath.   Cardiovascular:  Negative for chest pain, palpitations and leg swelling.  Gastrointestinal:  Negative for abdominal pain.  Endocrine: Negative for polydipsia.  Skin:  Negative for rash.  Neurological:  Negative for dizziness, weakness and headaches.  Hematological:  Does not bruise/bleed easily.  All other systems reviewed and are negative.      Objective:   Physical Exam Vitals and nursing note reviewed.  Constitutional:      Appearance: Normal appearance. He is well-developed.  HENT:     Head: Normocephalic.     Nose: Nose normal.     Mouth/Throat:     Mouth: Mucous membranes are moist.     Pharynx: Oropharynx is clear.  Eyes:      Pupils: Pupils are equal, round, and reactive to light.  Neck:     Thyroid: No thyroid mass or thyromegaly.     Vascular: No carotid bruit or JVD.     Trachea: Phonation normal.  Cardiovascular:     Rate and Rhythm: Normal rate and regular rhythm.  Pulmonary:     Effort: Pulmonary effort is normal. No respiratory distress.     Breath sounds: Normal breath sounds.  Abdominal:     General: Bowel sounds are normal.     Palpations: Abdomen is soft.     Tenderness: There is no abdominal tenderness.  Musculoskeletal:        General: Normal range of motion.     Cervical back: Normal range of motion and neck supple.  Lymphadenopathy:     Cervical: No cervical adenopathy.  Skin:    General: Skin is warm and dry.  Neurological:     Mental Status: He is alert and oriented to person, place, and time.  Psychiatric:        Behavior: Behavior normal.        Thought Content: Thought content normal.        Judgment: Judgment normal.    BP 125/81   Pulse 77   Temp 97.6 F (36.4 C) (Temporal)   Resp 20   Ht 5\' 8"  (1.727 m)   Wt 159 lb (72.1  kg)   SpO2 99%   BMI 24.18 kg/m         Assessment & Plan:   Estrella Myrtle in today with chief complaint of Medical Management of Chronic Issues   1. Gastroesophageal reflux disease with esophagitis without hemorrhage Avoid spicy foods Do not eat 2 hours prior to bedtime  - pantoprazole (PROTONIX) 20 MG tablet; Take 1 tablet (20 mg total) by mouth daily.  Dispense: 90 tablet; Refill: 3    The above assessment and management plan was discussed with the patient. The patient verbalized understanding of and has agreed to the management plan. Patient is aware to call the clinic if symptoms persist or worsen. Patient is aware when to return to the clinic for a follow-up visit. Patient educated on when it is appropriate to go to the emergency department.   Mary-Margaret Hassell Done, FNP

## 2023-01-03 ENCOUNTER — Telehealth (INDEPENDENT_AMBULATORY_CARE_PROVIDER_SITE_OTHER): Payer: 59 | Admitting: Family Medicine

## 2023-01-03 ENCOUNTER — Encounter: Payer: Self-pay | Admitting: Family Medicine

## 2023-01-03 DIAGNOSIS — J0100 Acute maxillary sinusitis, unspecified: Secondary | ICD-10-CM

## 2023-01-03 MED ORDER — AMOXICILLIN-POT CLAVULANATE 875-125 MG PO TABS: 1.0000 | ORAL_TABLET | Freq: Two times a day (BID) | ORAL | 0 refills | Status: DC

## 2023-01-03 NOTE — Progress Notes (Signed)
Subjective:    Patient ID: Sean Gilbert, male    DOB: 04/06/1996, 27 y.o.   MRN: [id number]   HPI: Sean Gilbert is a 27 y.o. male presenting for fatigue, scratchy throat. A lot of mucous, nasal DC and blood. Fever for 2 nights with sweats . Cough comes and goes. Not dyspneic. Decreased appetite.       12/29/2022   12:42 PM 05/13/2021    3:10 PM 03/25/2021    9:39 AM 03/05/2020    2:18 PM 09/04/2019    9:15 AM  Depression screen PHQ 2/9  Decreased Interest 0 0 0 0 0  Down, Depressed, Hopeless 0 0 0 0 0  PHQ - 2 Score 0 0 0 0 0  Altered sleeping 0  0    Tired, decreased energy 0  0    Change in appetite 0  0    Feeling bad or failure about yourself  0  0    Trouble concentrating 0  0    Moving slowly or fidgety/restless 0  0    Suicidal thoughts 0  0    PHQ-9 Score 0  0    Difficult doing work/chores Not difficult at all         Relevant past medical, surgical, family and social history reviewed and updated as indicated.  Interim medical history since our last visit reviewed. Allergies and medications reviewed and updated.  ROS:  Review of Systems  Constitutional:  Negative for activity change, appetite change, chills and fever.  HENT:  Positive for congestion, postnasal drip, rhinorrhea and sinus pressure. Negative for ear discharge, ear pain, hearing loss, nosebleeds, sneezing and trouble swallowing.   Respiratory:  Negative for chest tightness and shortness of breath.   Cardiovascular:  Negative for chest pain and palpitations.  Skin:  Negative for rash.     Social History   Tobacco Use  Smoking Status Never  Smokeless Tobacco Never       Objective:     Wt Readings from Last 3 Encounters:  12/29/22 159 lb (72.1 kg)  06/11/21 155 lb (70.3 kg)  05/13/21 154 lb (69.9 kg)     Exam deferred. Pt. Harboring due to COVID 19. Phone visit performed.   Assessment & Plan:   1. Acute maxillary sinusitis, recurrence not specified     Meds  ordered this encounter  Medications   amoxicillin-clavulanate (AUGMENTIN) 875-125 MG tablet    Sig: Take 1 tablet by mouth 2 (two) times daily. Take all of this medication    Dispense:  20 tablet    Refill:  0    Orders Placed This Encounter  Procedures   COVID-19, Flu A+B and RSV    Order Specific Question:   Previously tested for COVID-19    Answer:   Yes    Order Specific Question:   Resident in a congregate (group) care setting    Answer:   No    Order Specific Question:   Is the patient student?    Answer:   No    Order Specific Question:   Employed in healthcare setting    Answer:   No    Order Specific Question:   Has patient completed COVID vaccination(s) (2 doses of Pfizer/Moderna 1 dose of [employer])    Answer:   Unknown    Order Specific Question:   Release to patient    Answer:   Immediate      Diagnoses and all orders  for this visit:  Acute maxillary sinusitis, recurrence not specified -     COVID-19, Flu A+B and RSV  Other orders -     amoxicillin-clavulanate (AUGMENTIN) 875-125 MG tablet; Take 1 tablet by mouth 2 (two) times daily. Take all of this medication    Virtual Visit via telephone Note  I discussed the limitations, risks, security and privacy concerns of performing an evaluation and management service by telephone and the availability of in person appointments. The patient was identified with two identifiers. Pt.expressed understanding and agreed to proceed. Pt. Is at home. Dr. Livia Snellen is in his office.  Follow Up Instructions:   I discussed the assessment and treatment plan with the patient. The patient was provided an opportunity to ask questions and all were answered. The patient agreed with the plan and demonstrated an understanding of the instructions.   The patient was advised to call back or seek an in-person evaluation if the symptoms worsen or if the condition fails to improve as anticipated.   Total minutes including chart review  and phone contact time: 13   Follow up plan: Return if symptoms worsen or fail to improve.  Claretta Fraise, MD [place]

## 2023-01-05 LAB — COVID-19, FLU A+B AND RSV
Influenza A, NAA: NOT DETECTED
Influenza B, NAA: NOT DETECTED
RSV, NAA: NOT DETECTED
SARS-CoV-2, NAA: NOT DETECTED

## 2023-03-26 ENCOUNTER — Ambulatory Visit (INDEPENDENT_AMBULATORY_CARE_PROVIDER_SITE_OTHER): Payer: 59 | Admitting: Nurse Practitioner

## 2023-03-26 ENCOUNTER — Encounter: Payer: Self-pay | Admitting: Nurse Practitioner

## 2023-03-26 VITALS — BP 133/81 | HR 88 | Temp 97.7°F | Resp 20 | Ht 68.0 in | Wt 156.0 lb

## 2023-03-26 DIAGNOSIS — N5319 Other ejaculatory dysfunction: Secondary | ICD-10-CM | POA: Diagnosis not present

## 2023-03-26 DIAGNOSIS — R6882 Decreased libido: Secondary | ICD-10-CM | POA: Diagnosis not present

## 2023-03-26 DIAGNOSIS — N529 Male erectile dysfunction, unspecified: Secondary | ICD-10-CM | POA: Diagnosis not present

## 2023-03-26 NOTE — Progress Notes (Signed)
   Subjective:    Patient ID: Sean Gilbert, male    DOB: [birth year], 27 y.o.   MRN: [id number]   Chief Complaint: Decreased sex drive (Pain with ejaculation)   HPI  Patient Active Problem List   Diagnosis Date Noted   Mild intermittent asthma 03/19/2019   Gastroesophageal reflux disease with esophagitis 11/29/2018   Non-seasonal allergic rhinitis due to pollen 11/29/2018   Patient says for the past 2 months he has really not been interested in sex. Says that he is in a new relationship ad is not able to get a full erection and it stings when he ejaculates. He no longer has an erection when he wake sup in the morning.    Review of Systems  Constitutional:  Negative for diaphoresis.  Eyes:  Negative for pain.  Respiratory:  Negative for shortness of breath.   Cardiovascular:  Negative for chest pain, palpitations and leg swelling.  Gastrointestinal:  Negative for abdominal pain.  Endocrine: Negative for polydipsia.  Genitourinary:  Negative for dysuria, frequency, penile discharge, penile pain, penile swelling, scrotal swelling, testicular pain and urgency.  Skin:  Negative for rash.  Neurological:  Negative for dizziness, weakness and headaches.  Hematological:  Does not bruise/bleed easily.  All other systems reviewed and are negative.      Objective:   Physical Exam Constitutional:      Appearance: Normal appearance.  Cardiovascular:     Rate and Rhythm: Normal rate and regular rhythm.     Heart sounds: Normal heart sounds.  Pulmonary:     Effort: Pulmonary effort is normal.     Breath sounds: Normal breath sounds.  Genitourinary:    Penis: Normal.      Testes: Normal.  Skin:    General: Skin is warm.  Neurological:     General: No focal deficit present.     Mental Status: He is alert and oriented to person, place, and time.  Psychiatric:        Mood and Affect: Mood normal.        Behavior: Behavior normal.    BP 133/81   Pulse 88   Temp 97.7 F  (36.5 C) (Temporal)   Resp 20   Ht 5\' 8"  (1.727 m)   Wt 156 lb (70.8 kg)   SpO2 99%   BMI 23.72 kg/m         Assessment & Plan:   Sean Gilbert in today with chief complaint of Decreased sex drive (Pain with ejaculation)   1. Disorder of ejaculation Labs pending - Ct Ng M genitalium NAA, Urine  2. Decreased libido Labs pending - Testosterone,Free and Total  3. Erectile dysfunction, unspecified erectile dysfunction type Will talk once labs are back    The above assessment and management plan was discussed with the patient. The patient verbalized understanding of and has agreed to the management plan. Patient is aware to call the clinic if symptoms persist or worsen. Patient is aware when to return to the clinic for a follow-up visit. Patient educated on when it is appropriate to go to the emergency department.   Sean Daphine Deutscher, FNP

## 2023-03-26 NOTE — Patient Instructions (Signed)

## 2023-03-27 ENCOUNTER — Other Ambulatory Visit: Payer: 59

## 2023-03-27 DIAGNOSIS — R6882 Decreased libido: Secondary | ICD-10-CM

## 2023-03-28 LAB — CT NG M GENITALIUM NAA, URINE
Chlamydia trachomatis, NAA: NEGATIVE
Mycoplasma genitalium NAA: NEGATIVE
Neisseria gonorrhoeae, NAA: NEGATIVE

## 2023-03-29 NOTE — Progress Notes (Signed)
Pt r/c.

## 2023-03-31 LAB — TESTOSTERONE,FREE AND TOTAL
Testosterone, Free: 21 pg/mL (ref 9.3–26.5)
Testosterone: 545 ng/dL (ref 264–916)

## 2023-04-19 ENCOUNTER — Encounter: Payer: Self-pay | Admitting: Family Medicine

## 2023-04-19 ENCOUNTER — Telehealth (INDEPENDENT_AMBULATORY_CARE_PROVIDER_SITE_OTHER): Payer: 59 | Admitting: Family Medicine

## 2023-04-19 DIAGNOSIS — J069 Acute upper respiratory infection, unspecified: Secondary | ICD-10-CM

## 2023-04-19 MED ORDER — CHLORPHEN-PE-ACETAMINOPHEN 4-10-325 MG PO TABS: 1.0000 | ORAL_TABLET | Freq: Four times a day (QID) | ORAL | 0 refills | Status: DC | PRN

## 2023-04-19 NOTE — Progress Notes (Signed)
   Virtual Visit via video Note   Due to COVID-19 pandemic this visit was conducted virtually. This visit type was conducted due to national recommendations for restrictions regarding the COVID-19 Pandemic (e.g. social distancing, sheltering in place) in an effort to limit this patient's exposure and mitigate transmission in our community. All issues noted in this document were discussed and addressed.  A physical exam was not performed with this format.  I connected with  Sean Gilbert  on 04/19/23 at 1301 by video and verified that I am speaking with the correct person using two identifiers. Sean Gilbert is currently located at work and no one is currently with him during the visit. The provider, Gabriel Earing, FNP is located in their office at time of visit.  I discussed the limitations, risks, security and privacy concerns of performing an evaluation and management service by video  and the availability of in person appointments. I also discussed with the patient that there may be a patient responsible charge related to this service. The patient expressed understanding and agreed to proceed.  CC: fever  History and Present Illness:  Sean Gilbert reports cough, sore throat, nasal congestion x 2 days. Today he reports chills and feeling feverish. He denies shortness of breath, wheezing, nausea, vomiting, or diarrhea. He has discomfort in his chest with coughing. He has been taking mucinex with mild improvement.    ROS As per HPI.     Observations/Objective: Alert and oriented. Respirations unlabored. No cyanosis. Non toxic appearing. Normal mood and behavior.   Assessment and Plan: Sean Gilbert was seen today for fever.  Diagnoses and all orders for this visit:  Viral upper respiratory tract infection He will take a home Covid test as he now lives in [place]. Discussed quarantine until he takes a test. He will notify the office of results. Discussed symptomatic care and  return precautions.  -     Chlorphen-PE-Acetaminophen 4-10-325 MG TABS; Take 1 tablet by mouth every 6 (six) hours as needed.    Follow Up Instructions: Discussed symptomatic care and return precautions.     I discussed the assessment and treatment plan with the patient. The patient was provided an opportunity to ask questions and all were answered. The patient agreed with the plan and demonstrated an understanding of the instructions.   The patient was advised to call back or seek an in-person evaluation if the symptoms worsen or if the condition fails to improve as anticipated.  The above assessment and management plan was discussed with the patient. The patient verbalized understanding of and has agreed to the management plan. Patient is aware to call the clinic if symptoms persist or worsen. Patient is aware when to return to the clinic for a follow-up visit. Patient educated on when it is appropriate to go to the emergency department.   Time call ended:1306  I provided 5 minutes of face-to-face time during this encounter.    Gabriel Earing, FNP

## 2023-07-23 ENCOUNTER — Encounter: Payer: Self-pay | Admitting: Family Medicine

## 2023-07-23 ENCOUNTER — Ambulatory Visit (INDEPENDENT_AMBULATORY_CARE_PROVIDER_SITE_OTHER): Payer: Managed Care, Other (non HMO) | Admitting: Family Medicine

## 2023-07-23 VITALS — BP 97/59 | HR 78 | Temp 98.4°F | Ht 68.0 in | Wt 160.0 lb

## 2023-07-23 DIAGNOSIS — L237 Allergic contact dermatitis due to plants, except food: Secondary | ICD-10-CM | POA: Diagnosis not present

## 2023-07-23 MED ORDER — METHYLPREDNISOLONE ACETATE 80 MG/ML IJ SUSP
80.0000 mg | Freq: Once | INTRAMUSCULAR | Status: AC
  Administered 2023-07-23: 80 mg via INTRAMUSCULAR

## 2023-07-23 NOTE — Progress Notes (Signed)
   Acute Office Visit  Subjective:     Patient ID: Sean Gilbert, male    DOB: 08-11-1996, 27 y.o.   MRN: [id number]  Chief Complaint  Patient presents with   Rash    Rash This is a new problem. The current episode started in the past 7 days. The problem has been gradually worsening since onset. The affected locations include the abdomen, right hand, right arm, left arm, left hand, left lower leg and right lower leg. The rash is characterized by itchiness and blistering. He was exposed to plant contact. Pertinent negatives include no anorexia, congestion, cough, diarrhea, eye pain, facial edema, fatigue, fever, joint pain, nail changes, rhinorrhea, shortness of breath or sore throat. Past treatments include antihistamine, anti-itch cream, cold compress and topical steroids. The treatment provided mild relief.     Review of Systems  Constitutional:  Negative for fatigue and fever.  HENT:  Negative for congestion, rhinorrhea and sore throat.   Eyes:  Negative for pain.  Respiratory:  Negative for cough and shortness of breath.   Gastrointestinal:  Negative for anorexia and diarrhea.  Musculoskeletal:  Negative for joint pain.  Skin:  Positive for rash. Negative for nail changes.        Objective:    BP (!) 97/59   Pulse 78   Temp 98.4 F (36.9 C) (Temporal)   Ht 5\' 8"  (1.727 m)   Wt 160 lb (72.6 kg)   SpO2 96%   BMI 24.33 kg/m    Physical Exam Vitals and nursing note reviewed.  Constitutional:      General: He is not in acute distress.    Appearance: He is not ill-appearing, toxic-appearing or diaphoretic.  HENT:     Mouth/Throat:     Mouth: No angioedema.  Pulmonary:     Effort: Pulmonary effort is normal. No respiratory distress.  Musculoskeletal:     Right lower leg: No edema.     Left lower leg: No edema.  Skin:    General: Skin is warm and dry.     Findings: Rash (Linear dermatitis present on abdomen, bilateral arms, hands, and legs) present.   Neurological:     General: No focal deficit present.     Mental Status: He is alert and oriented to person, place, and time.  Psychiatric:        Mood and Affect: Mood normal.        Behavior: Behavior normal.     No results found for any visits on 07/23/23.      Assessment & Plan:   Sean "Thayer Ohm" was seen today for rash.  Diagnoses and all orders for this visit:  Plant allergic contact dermatitis Steroid IM injection today in office. Continue antihistamines, calamine lotion, hydrocortisone cream. If no improvement, will order oral prednisone burst as well. Return to office for new or worsening symptoms, or if symptoms persist.  -     methylPREDNISolone acetate (DEPO-MEDROL) injection 80 mg  The patient indicates understanding of these issues and agrees with the plan.  Gabriel Earing, FNP

## 2023-08-21 ENCOUNTER — Telehealth: Payer: Self-pay | Admitting: Nurse Practitioner

## 2023-08-21 NOTE — Telephone Encounter (Signed)
Pt is currently in basic training for the military and wanted to let PCP know that he has not taken any of his meds in the past 3 weeks and has not had any issues so he wants to be taken off all meds.

## 2023-08-21 NOTE — Telephone Encounter (Signed)
FYI

## 2023-11-09 ENCOUNTER — Ambulatory Visit (INDEPENDENT_AMBULATORY_CARE_PROVIDER_SITE_OTHER): Payer: Managed Care, Other (non HMO) | Admitting: Nurse Practitioner

## 2023-11-09 ENCOUNTER — Encounter: Payer: Self-pay | Admitting: Nurse Practitioner

## 2023-11-09 VITALS — BP 113/77 | HR 74 | Temp 97.8°F | Resp 20 | Ht 68.0 in | Wt 154.0 lb

## 2023-11-09 DIAGNOSIS — T50905S Adverse effect of unspecified drugs, medicaments and biological substances, sequela: Secondary | ICD-10-CM | POA: Diagnosis not present
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
## 2023-11-14 LAB — DRUG SCREEN 10 W/CONF, SERUM
Amphetamines, IA: NEGATIVE ng/mL
Barbiturates, IA: NEGATIVE ug/mL
Benzodiazepines, IA: NEGATIVE ng/mL
Cocaine & Metabolite, IA: NEGATIVE ng/mL
Methadone, IA: NEGATIVE ng/mL
Opiates, IA: NEGATIVE ng/mL
Oxycodones, IA: NEGATIVE ng/mL
Phencyclidine, IA: NEGATIVE ng/mL
Propoxyphene, IA: NEGATIVE ng/mL
THC(Marijuana) Metabolite, IA: NEGATIVE ng/mL

## 2023-11-26 ENCOUNTER — Encounter: Payer: Self-pay | Admitting: Nurse Practitioner

## 2023-11-26 ENCOUNTER — Encounter (INDEPENDENT_AMBULATORY_CARE_PROVIDER_SITE_OTHER): Payer: Managed Care, Other (non HMO) | Admitting: Nurse Practitioner

## 2023-11-26 NOTE — Progress Notes (Signed)
Erroneous encounter= unable to reach patient vis text and/or phone  Mary-Margaret Daphine Deutscher, FNP

## 2023-11-26 NOTE — Progress Notes (Deleted)
Virtual Visit Consent   Sean Gilbert, you are scheduled for a virtual visit with Mary-Margaret Daphine Deutscher, FNP, a [facility] provider, today.     Just as with appointments in the office, your consent must be obtained to participate.  Your consent will be active for this visit and any virtual visit you may have with one of our providers in the next 365 days.     If you have a MyChart account, a copy of this consent can be sent to you electronically.  All virtual visits are billed to your insurance company just like a traditional visit in the office.    As this is a virtual visit, video technology does not allow for your provider to perform a traditional examination.  This may limit your provider's ability to fully assess your condition.  If your provider identifies any concerns that need to be evaluated in person or the need to arrange testing (such as labs, EKG, etc.), we will make arrangements to do so.     Although advances in technology are sophisticated, we cannot ensure that it will always work on either your end or our end.  If the connection with a video visit is poor, the visit may have to be switched to a telephone visit.  With either a video or telephone visit, we are not always able to ensure that we have a secure connection.     I need to obtain your verbal consent now.   Are you willing to proceed with your visit today? YES   Sean Gilbert has provided verbal consent on 11/26/2023 for a virtual visit (video or telephone).   Mary-Margaret Daphine Deutscher, FNP   Date: 11/26/2023 11:07 AM   Virtual Visit via Video Note   I, Mary-Margaret Daphine Deutscher, connected with Sean Gilbert ([id number], 12-11-1996) on 11/26/23 at 10:30 AM EST by a video-enabled telemedicine application and verified that I am speaking with the correct person using two identifiers.  Location: Patient: {Virtual Visit Location Patient:[address]::"Home"} Provider: Virtual Visit Location Provider: Mobile    I discussed the limitations of evaluation and management by telemedicine and the availability of in person appointments. The patient expressed understanding and agreed to proceed.    History of Present Illness: Sean Gilbert is a 27 y.o. who identifies as a male who was assigned male at birth, and is being seen today for ***.  HPI: HPI  ROS  Problems:  Patient Active Problem List   Diagnosis Date Noted  . Mild intermittent asthma 03/19/2019  . Gastroesophageal reflux disease with esophagitis 11/29/2018  . Non-seasonal allergic rhinitis due to pollen 11/29/2018    Allergies: No Known Allergies Medications: No current outpatient medications on file.  Observations/Objective: Patient is well-developed, well-nourished in no acute distress.  Resting comfortably *** at home.  Head is normocephalic, atraumatic.  No labored breathing. *** Speech is clear and coherent with logical content.  Patient is alert and oriented at baseline.  ***  Assessment and Plan:  ***  Follow Up Instructions: I discussed the assessment and treatment plan with the patient. The patient was provided an opportunity to ask questions and all were answered. The patient agreed with the plan and demonstrated an understanding of the instructions.  A copy of instructions were sent to the patient via MyChart.  The patient was advised to call back or seek an in-person evaluation if the symptoms worsen or if the condition fails to improve as anticipated.  Time:  I spent *** minutes with  the patient via telehealth technology discussing the above problems/concerns.    Mary-Margaret Daphine Deutscher, FNP

## 2023-11-29 ENCOUNTER — Encounter: Payer: Self-pay | Admitting: Nurse Practitioner

## 2023-11-29 ENCOUNTER — Telehealth (INDEPENDENT_AMBULATORY_CARE_PROVIDER_SITE_OTHER): Payer: Managed Care, Other (non HMO) | Admitting: Nurse Practitioner

## 2023-11-29 DIAGNOSIS — N200 Calculus of kidney: Secondary | ICD-10-CM | POA: Diagnosis not present

## 2023-11-29 DIAGNOSIS — M51369 Other intervertebral disc degeneration, lumbar region without mention of lumbar back pain or lower extremity pain: Secondary | ICD-10-CM | POA: Diagnosis not present

## 2023-11-29 MED ORDER — CELECOXIB 200 MG PO CAPS: 200.0000 mg | ORAL_CAPSULE | Freq: Two times a day (BID) | ORAL | 2 refills | Status: DC

## 2023-11-29 NOTE — Patient Instructions (Signed)
Acute Back Pain, Adult Acute back pain is sudden and usually short-lived. It is often caused by an injury to the muscles and tissues in the back. The injury may result from: A muscle, tendon, or ligament getting overstretched or torn. Ligaments are tissues that connect bones to each other. Lifting something improperly can cause a back strain. Wear and tear (degeneration) of the spinal disks. Spinal disks are circular tissue that provide cushioning between the bones of the spine (vertebrae). Twisting motions, such as while playing sports or doing yard work. A hit to the back. Arthritis. You may have a physical exam, lab tests, and imaging tests to find the cause of your pain. Acute back pain usually goes away with rest and home care. Follow these instructions at home: Managing pain, stiffness, and swelling Take over-the-counter and prescription medicines only as told by your health care provider. Treatment may include medicines for pain and inflammation that are taken by mouth or applied to the skin, or muscle relaxants. Your health care provider may recommend applying ice during the first 24-48 hours after your pain starts. To do this: Put ice in a plastic bag. Place a towel between your skin and the bag. Leave the ice on for 20 minutes, 2-3 times a day. Remove the ice if your skin turns bright red. This is very important. If you cannot feel pain, heat, or cold, you have a greater risk of damage to the area. If directed, apply heat to the affected area as often as told by your health care provider. Use the heat source that your health care provider recommends, such as a moist heat pack or a heating pad. Place a towel between your skin and the heat source. Leave the heat on for 20-30 minutes. Remove the heat if your skin turns bright red. This is especially important if you are unable to feel pain, heat, or cold. You have a greater risk of getting burned. Activity  Do not stay in bed. Staying in  bed for more than 1-2 days can delay your recovery. Sit up and stand up straight. Avoid leaning forward when you sit or hunching over when you stand. If you work at a desk, sit close to it so you do not need to lean over. Keep your chin tucked in. Keep your neck drawn back, and keep your elbows bent at a 90-degree angle (right angle). Sit high and close to the steering wheel when you drive. Add lower back (lumbar) support to your car seat, if needed. Take short walks on even surfaces as soon as you are able. Try to increase the length of time you walk each day. Do not sit, drive, or stand in one place for more than 30 minutes at a time. Sitting or standing for long periods of time can put stress on your back. Do not drive or use heavy machinery while taking prescription pain medicine. Use proper lifting techniques. When you bend and lift, use positions that put less stress on your back: Bend your knees. Keep the load close to your body. Avoid twisting. Exercise regularly as told by your health care provider. Exercising helps your back heal faster and helps prevent back injuries by keeping muscles strong and flexible. Work with a physical therapist to make a safe exercise program, as recommended by your health care provider. Do any exercises as told by your physical therapist. Lifestyle Maintain a healthy weight. Extra weight puts stress on your back and makes it difficult to have good   posture. Avoid activities or situations that make you feel anxious or stressed. Stress and anxiety increase muscle tension and can make back pain worse. Learn ways to manage anxiety and stress, such as through exercise. General instructions Sleep on a firm mattress in a comfortable position. Try lying on your side with your knees slightly bent. If you lie on your back, put a pillow under your knees. Keep your head and neck in a straight line with your spine (neutral position) when using electronic equipment like  smartphones or pads. To do this: Raise your smartphone or pad to look at it instead of bending your head or neck to look down. Put the smartphone or pad at the level of your face while looking at the screen. Follow your treatment plan as told by your health care provider. This may include: Cognitive or behavioral therapy. Acupuncture or massage therapy. Meditation or yoga. Contact a health care provider if: You have pain that is not relieved with rest or medicine. You have increasing pain going down into your legs or buttocks. Your pain does not improve after 2 weeks. You have pain at night. You lose weight without trying. You have a fever or chills. You develop nausea or vomiting. You develop abdominal pain. Get help right away if: You develop new bowel or bladder control problems. You have unusual weakness or numbness in your arms or legs. You feel faint. These symptoms may represent a serious problem that is an emergency. Do not wait to see if the symptoms will go away. Get medical help right away. Call your local emergency services (911 in the [place] Do not drive yourself to the hospital. Summary Acute back pain is sudden and usually short-lived. Use proper lifting techniques. When you bend and lift, use positions that put less stress on your back. Take over-the-counter and prescription medicines only as told by your health care provider, and apply heat or ice as told. This information is not intended to replace advice given to you by your health care provider. Make sure you discuss any questions you have with your health care provider. Document Revised: 02/25/2021 Document Reviewed: 02/25/2021 Elsevier Patient Education  2024 Elsevier Inc.  
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
## 2023-12-03 ENCOUNTER — Telehealth: Payer: Self-pay | Admitting: Nurse Practitioner

## 2023-12-03 NOTE — Telephone Encounter (Signed)
Letter written and sent to Mychart per patients request

## 2023-12-03 NOTE — Telephone Encounter (Signed)
Copied from CRM [id number]. Topic: General - Other >> Dec 03, 2023  8:48 AM Fonda Kinder J wrote: Reason for CRM: Pt needs a note to return back to work but with restrictions. Pt is unable to bend & unable to lift over 10 pounds.

## 2023-12-17 ENCOUNTER — Telehealth: Payer: Self-pay | Admitting: Nurse Practitioner

## 2023-12-17 NOTE — Telephone Encounter (Unsigned)
Copied from CRM [id number]. Topic: General - Other >> Dec 17, 2023 10:17 AM Antony Haste wrote: Reason for CRM: PT states he needs approval of full-duty release to go back to work. He would prefer to have this sent through MyChart.

## 2023-12-20 ENCOUNTER — Encounter: Payer: Self-pay | Admitting: Nurse Practitioner

## 2023-12-20 NOTE — Telephone Encounter (Signed)
 Ok for note to return to full duty at work

## 2023-12-20 NOTE — Telephone Encounter (Signed)
 Work note provided sent to patient through [employer].

## 2024-01-29 ENCOUNTER — Encounter: Payer: Self-pay | Admitting: Nurse Practitioner

## 2024-01-29 ENCOUNTER — Telehealth (INDEPENDENT_AMBULATORY_CARE_PROVIDER_SITE_OTHER): Payer: Managed Care, Other (non HMO) | Admitting: Nurse Practitioner

## 2024-01-29 DIAGNOSIS — K2100 Gastro-esophageal reflux disease with esophagitis, without bleeding: Secondary | ICD-10-CM

## 2024-01-29 MED ORDER — PANTOPRAZOLE SODIUM 40 MG PO TBEC
40.0000 mg | DELAYED_RELEASE_TABLET | Freq: Every day | ORAL | 3 refills | Status: DC
Start: 2024-01-29 — End: 2024-07-09

## 2024-01-29 MED ORDER — PANTOPRAZOLE SODIUM 40 MG PO TBEC: 40.0000 mg | DELAYED_RELEASE_TABLET | Freq: Every day | ORAL | 3 refills | Status: DC

## 2024-01-29 NOTE — Patient Instructions (Signed)
GERD in Adults: Diet Changes When you have gastroesophageal reflux disease (GERD), you may need to make changes to your diet. Choosing the right foods can help with your symptoms. Think about working with an expert in healthy eating called a dietitian. They can help you make healthy food choices. What are tips for following this plan? Reading food labels Look for foods that are low in saturated fat. Foods that may help with your symptoms include: Foods with less than 5% of daily value (DV) of fat. Foods with 0 grams of trans fat. Cooking [employer] in ways that don't use a lot of fat. These ways include: Baking. Steaming. Grilling. Broiling. To add flavor, try to use herbs that are low in spice and acidity. Avoid frying your food. Meal planning  Eat small meals often rather than eating 3 large meals each day. Eat your meals slowly in a place where you feel relaxed. If told by your health care provider, avoid: Foods that cause symptoms. Keep a food diary to keep track of foods that cause symptoms. Alcohol. Drinking a lot of liquid with meals. General instructions For 2-3 hours after you eat, avoid: Bending over. Exercise. Lying down. Chew sugar-free gum after meals. What foods should I eat? Eat a healthy diet. Try to include: Foods with high amounts of fiber. These include: Fruits and vegetables. Whole grains and beans. Low-fat dairy products. Lean meats, fish, and poultry. Egg whites. Foods that cause symptoms in someone else may not cause symptoms for you. Work with your provider to find foods that are safe for you. The items listed above may not be all the foods and drinks you can have. Talk with a dietitian to learn more. The items listed above may not be a complete list of foods and beverages you can eat and drink. Contact a dietitian for more information. What foods should I avoid? Limiting some of these foods may help with your symptoms. Each person is different.  Talk with a dietitian or your provider to help you find the exact foods to avoid. Some of the foods to avoid may include: Fruits Fruits with a lot of acid in them. These may include citrus fruits, such as oranges, grapefruit, pineapple, and lemons. Vegetables Deep-fried vegetables, such as [place] fries. Vegetables, sauces, or toppings made with added fat and vegetables with acid in them. These may include tomatoes and tomato products, chili peppers, onions, garlic, and horseradish. Grains Pastries or quick breads with added fat. Meats and other proteins High-fat meats, such as fatty beef or pork, hot dogs, ribs, ham, sausage, salami, and bacon. Fried meat or protein, such as fried fish and fried chicken. Egg yolks. Fats and oils Butter. Margarine. Shortening. Ghee. Drinks Coffee and other drinks with caffeine in them. Fizzy and sugary drinks, such as soda and energy drinks. Fruit juice made with acidic fruits, such as orange or grapefruit. Tomato juice. Sweets and desserts Chocolate and cocoa. Donuts. Seasonings and condiments Mint, such as peppermint and spearmint. Condiments, herbs, or seasonings that cause symptoms. These may include curry, hot sauce, or vinegar-based salad dressings. The items listed above may not be all the foods and drinks you should avoid. Talk with a dietitian to learn more. Questions to ask your health care provider Changes to your diet and everyday life are often the first steps taken to manage symptoms of GERD. If these changes don't help, talk with your provider about taking medicines. Where to find more information [employer]intestinal [employer]:  aboutgerd.org This information is not intended to replace advice given to you by your health care provider. Make sure you discuss any questions you have with your health care provider. Document Revised: 10/16/2023 Document Reviewed: 05/02/2023 Elsevier Patient Education  2024 [employer].

## 2024-01-29 NOTE — Progress Notes (Signed)
Virtual Visit Consent   Sean Gilbert, you are scheduled for a virtual visit with Mary-Margaret Daphine Deutscher, FNP, a [facility] provider, today.     Just as with appointments in the office, your consent must be obtained to participate.  Your consent will be active for this visit and any virtual visit you may have with one of our providers in the next 365 days.     If you have a MyChart account, a copy of this consent can be sent to you electronically.  All virtual visits are billed to your insurance company just like a traditional visit in the office.    As this is a virtual visit, video technology does not allow for your provider to perform a traditional examination.  This may limit your provider's ability to fully assess your condition.  If your provider identifies any concerns that need to be evaluated in person or the need to arrange testing (such as labs, EKG, etc.), we will make arrangements to do so.     Although advances in technology are sophisticated, we cannot ensure that it will always work on either your end or our end.  If the connection with a video visit is poor, the visit may have to be switched to a telephone visit.  With either a video or telephone visit, we are not always able to ensure that we have a secure connection.     I need to obtain your verbal consent now.   Are you willing to proceed with your visit today? YES   ZACCHARY POMPEI has provided verbal consent on 01/29/2024 for a virtual visit (video or telephone).   Mary-Margaret Daphine Deutscher, FNP   Date: 01/29/2024 9:41 AM   Virtual Visit via Video Note   I, Mary-Margaret Daphine Deutscher, connected with Sean Gilbert ([id number], [birth year]) on 01/29/24 at 11:30 AM EST by a video-enabled telemedicine application and verified that I am speaking with the correct person using two identifiers.  Location: Patient: Virtual Visit Location Patient: Home Provider: Virtual Visit Location Provider: Mobile   I discussed  the limitations of evaluation and management by telemedicine and the availability of in person appointments. The patient expressed understanding and agreed to proceed.    History of Present Illness: Sean Gilbert is a 28 y.o. who identifies as a male who was assigned male at birth, and is being seen today for GERD.  HPI: Patient c/o GERD. Started several years ago. Use to be on protonix but has been out for awhile. Has acid reflux symptoms almost daily. Seems to be worse at night when laying down.    Review of Systems  Constitutional:  Negative for diaphoresis and weight loss.  Eyes:  Negative for blurred vision, double vision and pain.  Respiratory:  Negative for shortness of breath.   Cardiovascular:  Negative for chest pain, palpitations, orthopnea and leg swelling.  Gastrointestinal:  Negative for abdominal pain, constipation, nausea and vomiting.  Skin:  Negative for rash.  Neurological:  Negative for dizziness, sensory change, loss of consciousness, weakness and headaches.  Endo/Heme/Allergies:  Negative for polydipsia. Does not bruise/bleed easily.  Psychiatric/Behavioral:  Negative for memory loss. The patient does not have insomnia.   All other systems reviewed and are negative.   Problems:  Patient Active Problem List   Diagnosis Date Noted   Mild intermittent asthma 03/19/2019   Gastroesophageal reflux disease with esophagitis 11/29/2018   Non-seasonal allergic rhinitis due to pollen 11/29/2018    Allergies: No Known Allergies Medications:  Current Outpatient Medications:    celecoxib (CELEBREX) 200 MG capsule, Take 1 capsule (200 mg total) by mouth 2 (two) times daily., Disp: 60 capsule, Rfl: 2  Observations/Objective: Patient is well-developed, well-nourished in no acute distress.  Resting comfortably  at home.  Head is normocephalic, atraumatic.  No labored breathing.  Speech is clear and coherent with logical content.  Patient is alert and oriented at  baseline.    Assessment and Plan:  [name] in today with chief complaint of No chief complaint on file.   1. Gastroesophageal reflux disease with esophagitis without hemorrhage (Primary) Avoid spicy foods Do not eat 2 hours prior to bedtime  - pantoprazole (PROTONIX) 40 MG tablet; Take 1 tablet (40 mg total) by mouth daily.  Dispense: 30 tablet; Refill: 3    Follow Up Instructions: I discussed the assessment and treatment plan with the patient. The patient was provided an opportunity to ask questions and all were answered. The patient agreed with the plan and demonstrated an understanding of the instructions.  A copy of instructions were sent to the patient via MyChart.  The patient was advised to call back or seek an in-person evaluation if the symptoms worsen or if the condition fails to improve as anticipated.  Time:  I spent 6 minutes with the patient via telehealth technology discussing the above problems/concerns.    Mary-Margaret Daphine Deutscher, FNP

## 2024-02-19 ENCOUNTER — Ambulatory Visit: Payer: Self-pay | Admitting: Nurse Practitioner

## 2024-02-19 ENCOUNTER — Ambulatory Visit (INDEPENDENT_AMBULATORY_CARE_PROVIDER_SITE_OTHER): Admitting: Nurse Practitioner

## 2024-02-19 ENCOUNTER — Encounter: Payer: Self-pay | Admitting: Nurse Practitioner

## 2024-02-19 VITALS — BP 119/74 | HR 76 | Temp 97.2°F | Ht 68.0 in | Wt 152.0 lb

## 2024-02-19 DIAGNOSIS — M545 Low back pain, unspecified: Secondary | ICD-10-CM

## 2024-02-19 MED ORDER — TRAMADOL HCL 50 MG PO TABS: 50.0000 mg | ORAL_TABLET | Freq: Three times a day (TID) | ORAL | 0 refills | Status: AC | PRN

## 2024-02-19 MED ORDER — PREDNISONE 20 MG PO TABS: 40.0000 mg | ORAL_TABLET | Freq: Every day | ORAL | 0 refills | Status: AC

## 2024-02-19 NOTE — Progress Notes (Signed)
   Subjective:    Patient ID: Sean Gilbert, male    DOB: Dec 04, 1996, 28 y.o.   MRN: [id number]   Chief Complaint: Raised area on back (Noticed 2 weeks ago. Started hurting last week/)   HPI  Found a "raised" area on back 2 weeks ago. Started hurting last week. Some bigger. No drainage pain is sharp and can last for several hours. Rates 8/10. Patient Active Problem List   Diagnosis Date Noted   Mild intermittent asthma 03/19/2019   Gastroesophageal reflux disease with esophagitis 11/29/2018   Non-seasonal allergic rhinitis due to pollen 11/29/2018       Review of Systems  Constitutional:  Negative for diaphoresis.  Eyes:  Negative for pain.  Respiratory:  Negative for shortness of breath.   Cardiovascular:  Negative for chest pain, palpitations and leg swelling.  Gastrointestinal:  Negative for abdominal pain.  Endocrine: Negative for polydipsia.  Skin:  Negative for rash.  Neurological:  Negative for dizziness, weakness and headaches.  Hematological:  Does not bruise/bleed easily.  All other systems reviewed and are negative.      Objective:   Physical Exam Constitutional:      Appearance: Normal appearance.  Cardiovascular:     Rate and Rhythm: Normal rate and regular rhythm.     Heart sounds: Normal heart sounds.  Pulmonary:     Effort: Pulmonary effort is normal.     Breath sounds: Normal breath sounds.  Musculoskeletal:     Comments: 2cm cystic superficial node right lower back. Pain right lower back on palpation  Skin:    General: Skin is warm.  Neurological:     General: No focal deficit present.     Mental Status: He is alert and oriented to person, place, and time.  Psychiatric:        Mood and Affect: Mood normal.        Behavior: Behavior normal.     BP 119/74   Pulse 76   Temp (!) 97.2 F (36.2 C) (Temporal)   Ht 5\' 8"  (1.727 m)   Wt 152 lb (68.9 kg)   SpO2 98%   BMI 23.11 kg/m        Assessment & Plan:  Sean Gilbert  in today with chief complaint of Raised area on back (Noticed 2 weeks ago. Started hurting last week/)   1. Acute right-sided low back pain without sciatica (Primary) Moist heat Rest  RTO prn - predniSONE (DELTASONE) 20 MG tablet; Take 2 tablets (40 mg total) by mouth daily with breakfast for 5 days. 2 po daily for 5 days  Dispense: 10 tablet; Refill: 0 - traMADol (ULTRAM) 50 MG tablet; Take 1 tablet (50 mg total) by mouth every 8 (eight) hours as needed for up to 5 days.  Dispense: 15 tablet; Refill: 0    The above assessment and management plan was discussed with the patient. The patient verbalized understanding of and has agreed to the management plan. Patient is aware to call the clinic if symptoms persist or worsen. Patient is aware when to return to the clinic for a follow-up visit. Patient educated on when it is appropriate to go to the emergency department.   Mary-Margaret Daphine Deutscher, FNP

## 2024-02-19 NOTE — Patient Instructions (Signed)
 Acute Back Pain, Adult Acute back pain is sudden and usually short-lived. It is often caused by an injury to the muscles and tissues in the back. The injury may result from: A muscle, tendon, or ligament getting overstretched or torn. Ligaments are tissues that connect bones to each other. Lifting something improperly can cause a back strain. Wear and tear (degeneration) of the spinal disks. Spinal disks are circular tissue that provide cushioning between the bones of the spine (vertebrae). Twisting motions, such as while playing sports or doing yard work. A hit to the back. Arthritis. You may have a physical exam, lab tests, and imaging tests to find the cause of your pain. Acute back pain usually goes away with rest and home care. Follow these instructions at home: Managing pain, stiffness, and swelling Take over-the-counter and prescription medicines only as told by your health care provider. Treatment may include medicines for pain and inflammation that are taken by mouth or applied to the skin, or muscle relaxants. Your health care provider may recommend applying ice during the first 24-48 hours after your pain starts. To do this: Put ice in a plastic bag. Place a towel between your skin and the bag. Leave the ice on for 20 minutes, 2-3 times a day. Remove the ice if your skin turns bright red. This is very important. If you cannot feel pain, heat, or cold, you have a greater risk of damage to the area. If directed, apply heat to the affected area as often as told by your health care provider. Use the heat source that your health care provider recommends, such as a moist heat pack or a heating pad. Place a towel between your skin and the heat source. Leave the heat on for 20-30 minutes. Remove the heat if your skin turns bright red. This is especially important if you are unable to feel pain, heat, or cold. You have a greater risk of getting burned. Activity  Do not stay in bed. Staying in  bed for more than 1-2 days can delay your recovery. Sit up and stand up straight. Avoid leaning forward when you sit or hunching over when you stand. If you work at a desk, sit close to it so you do not need to lean over. Keep your chin tucked in. Keep your neck drawn back, and keep your elbows bent at a 90-degree angle (right angle). Sit high and close to the steering wheel when you drive. Add lower back (lumbar) support to your car seat, if needed. Take short walks on even surfaces as soon as you are able. Try to increase the length of time you walk each day. Do not sit, drive, or stand in one place for more than 30 minutes at a time. Sitting or standing for long periods of time can put stress on your back. Do not drive or use heavy machinery while taking prescription pain medicine. Use proper lifting techniques. When you bend and lift, use positions that put less stress on your back: [place] your knees. Keep the load close to your body. Avoid twisting. Exercise regularly as told by your health care provider. Exercising helps your back heal faster and helps prevent back injuries by keeping muscles strong and flexible. Work with a physical therapist to make a safe exercise program, as recommended by your health care provider. Do any exercises as told by your physical therapist. Lifestyle Maintain a healthy weight. Extra weight puts stress on your back and makes it difficult to have good  posture. Avoid activities or situations that make you feel anxious or stressed. Stress and anxiety increase muscle tension and can make back pain worse. Learn ways to manage anxiety and stress, such as through exercise. General instructions Sleep on a firm mattress in a comfortable position. Try lying on your side with your knees slightly bent. If you lie on your back, put a pillow under your knees. Keep your head and neck in a straight line with your spine (neutral position) when using electronic equipment like  smartphones or pads. To do this: Raise your smartphone or pad to look at it instead of bending your head or neck to look down. Put the smartphone or pad at the level of your face while looking at the screen. Follow your treatment plan as told by your health care provider. This may include: Cognitive or behavioral therapy. Acupuncture or massage therapy. Meditation or yoga. Contact a health care provider if: You have pain that is not relieved with rest or medicine. You have increasing pain going down into your legs or buttocks. Your pain does not improve after 2 weeks. You have pain at night. You lose weight without trying. You have a fever or chills. You develop nausea or vomiting. You develop abdominal pain. Get help right away if: You develop new bowel or bladder control problems. You have unusual weakness or numbness in your arms or legs. You feel faint. These symptoms may represent a serious problem that is an emergency. Do not wait to see if the symptoms will go away. Get medical help right away. Call your local emergency services (911 in the [place] Do not drive yourself to the hospital. Summary Acute back pain is sudden and usually short-lived. Use proper lifting techniques. When you bend and lift, use positions that put less stress on your back. Take over-the-counter and prescription medicines only as told by your health care provider, and apply heat or ice as told. This information is not intended to replace advice given to you by your health care provider. Make sure you discuss any questions you have with your health care provider. Document Revised: 02/25/2021 Document Reviewed: 02/25/2021 Elsevier Patient Education  2024 [employer].

## 2024-02-19 NOTE — Telephone Encounter (Signed)
  Chief Complaint: lump under skin, R low back Symptoms: pain, swelling Frequency: 2-3 weeks Pertinent Negatives: Patient denies fever, redness, streaking, injury Disposition: [] ED /[] Urgent Care (no appt availability in office) / [x] Appointment(In office/virtual)/ []  [facility]/ [] Home Care/ [] Refused Recommended Disposition /[] [facility]/ []  Follow-up with PCP Additional Notes: Patient calls reporting lump under skin on R low back x 2-3 weeks. Patient reports he was seen in UC and has been using heat, ice, OTC medications without relief. Per protocol, patient to be evaluated within 24 hours. First available appointment with PCP today at 0915. Patient scheduled. Care advice reviewed, patient verbalized understandingand denies further questions at this time. Alerting PCP for review.    Copied from CRM [id number]. Topic: Clinical - Red Word Triage >> Feb 19, 2024  8:02 AM Hamdi H wrote: [facility] that prompted transfer to Nurse Triage: Lump on lower back causing severe pain. Reason for Disposition  [1] Swelling is painful to touch AND [2] no fever  Answer Assessment - Initial Assessment Questions 1. APPEARANCE of SWELLING: "What does it look like?"     Swelling, normal skin color 2. SIZE: "How large is the swelling?" (e.g., inches, cm; or compare to size of pinhead, tip of pen, eraser, coin, pea, grape, ping pong ball)      Penny size 3. LOCATION: "Where is the swelling located?"     Right lower back, intense at times then to throbbing, radiating around right side 4. ONSET: "When did the swelling start?"     2 to 3 weeks 5. COLOR: "What color is it?" "Is there more than one color?"     Skin color 6. PAIN: "Is there any pain?" If Yes, ask: "How bad is the pain?" (e.g., scale 1-10; or mild, moderate, severe)     - NONE (0): no pain   - MILD (1-3): doesn't interfere with normal activities    - MODERATE (4-7): interferes with normal activities or awakens from sleep    -  SEVERE (8-10): excruciating pain, unable to do any normal activities     5/10 7. ITCH: "Does it itch?" If Yes, ask: "How bad is the itch?"      Denies 8. CAUSE: "What do you think caused the swelling?" 9 OTHER SYMPTOMS: "Do you have any other symptoms?" (e.g., fever)     Denies  Protocols used: Skin Lump or Localized Swelling-A-AH

## 2024-02-19 NOTE — Telephone Encounter (Signed)
 Appt made

## 2024-05-29 ENCOUNTER — Encounter: Payer: Self-pay | Admitting: Family Medicine

## 2024-05-29 ENCOUNTER — Ambulatory Visit: Payer: Self-pay

## 2024-05-29 ENCOUNTER — Ambulatory Visit: Admitting: Family Medicine

## 2024-05-29 DIAGNOSIS — R2232 Localized swelling, mass and lump, left upper limb: Secondary | ICD-10-CM

## 2024-05-29 MED ORDER — NAPROXEN 500 MG PO TABS: 500.0000 mg | ORAL_TABLET | Freq: Two times a day (BID) | ORAL | 0 refills | Status: DC

## 2024-05-29 MED ORDER — METHYLPREDNISOLONE ACETATE 40 MG/ML IJ SUSP
40.0000 mg | Freq: Once | INTRAMUSCULAR | Status: AC
  Administered 2024-05-29: 40 mg via INTRAMUSCULAR

## 2024-05-29 NOTE — Telephone Encounter (Signed)
  FYI Only or Action Required?: FYI only for provider  Patient was last seen in primary care on 02/19/2024 by Delfina Feller, FNP. Called Nurse Triage reporting Mass. Symptoms began several weeks ago. Interventions attempted: Rest, hydration, or home remedies and Ice/heat application. Symptoms are: localized left forearm/antecubital swelling (size of a golf ball) stable.  Triage Disposition: See PCP When Office is Open (Within 3 Days)  Patient/caregiver understands and will follow disposition?: Yes                      Copied from CRM [id number]. Topic: Clinical - Red Word Triage >> May 29, 2024  1:14 PM Tiffany H wrote: Red Word: Patient called to advise that he has swelling/a lump on the crease of his forearm. It's the size of a golf ball. Patient had to stop going to the gym. No pain, but the feeling when he lifts is annoying. Patient advised that the lump has come and gone several times over the last 2 weeks. Please assist. Reason for Disposition  [1] Small swelling or lump AND [2] unexplained AND [3] present > 1 week  Answer Assessment - Initial Assessment Questions 1. APPEARANCE of SWELLING: What does it look like?     He states it looks like normal skin but just localized swelling, soft to the touch.  2. SIZE: How large is the swelling? (e.g., inches, cm; or compare to size of pinhead, tip of pen, eraser, coin, pea, grape, ping pong ball)      About the size of a golf ball.  3. LOCATION: Where is the swelling located?     Left arm, near bend of elbow on forearm side.  4. ONSET: When did the swelling start?     Past few weeks.  5. COLOR: What color is it? Is there more than one color?     No.  6. PAIN: Is there any pain? If Yes, ask: How bad is the pain? (e.g., scale 1-10; or mild, moderate, severe)     - NONE (0): no pain   - MILD (1-3): doesn't interfere with normal activities    - MODERATE (4-7): interferes with normal activities or  awakens from sleep    - SEVERE (8-10): excruciating pain, unable to do any normal activities     Pulling type of pain, it's not like a pain, more like irritating.  7. ITCH: Does it itch? If Yes, ask: How bad is the itch?      No.  8. CAUSE: What do you think caused the swelling?     He states the swelling comes and goes, worsens when doing physical activities involving his arms like lifting. He states he injured his elbow about a month or 2 ago.  9 OTHER SYMPTOMS: Do you have any other symptoms? (e.g., fever)     No.  Protocols used: Skin Lump or Localized Swelling-A-AH

## 2024-05-29 NOTE — Telephone Encounter (Signed)
 Apt scheduled.

## 2024-05-29 NOTE — Progress Notes (Signed)
 Subjective:  Patient ID: Sean Gilbert, male    DOB: [birth year], 28 y.o.   MRN: [id number]  Patient Care Team: Delfina Feller, FNP as PCP - General (Family Medicine)   Chief Complaint:  left elbow swollen (X 1 month/Goes up and down/Work with repetitive motions/)  HPI: Sean Gilbert is a 28 y.o. male presenting on 05/29/2024 for left elbow swollen (X 1 month/Goes up and down/Work with repetitive motions/)  HPI States that symptoms started 1-1.5 months ago. 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 Took one week off of working out and it did not improve. Reports some tenderness, pain. Reports numbness and tingling that radiates down. Does not play other sports, only exercises.  Has tried icing it, bracing it sporadically. Taken some advil  and muscle relaxer twice.   Relevant past medical, surgical, family, and social history reviewed and updated as indicated.  Allergies and medications reviewed and updated. Data reviewed: Chart in Epic.   Past Medical History:  Diagnosis Date   Allergy     Past Surgical History:  Procedure Laterality Date   TONSILLECTOMY     tubes in ears      Social History   Socioeconomic History   Marital status: Single    Spouse name: Not on file   Number of children: Not on file   Years of education: Not on file   Highest education level: Not on file  Occupational History   Not on file  Tobacco Use   Smoking status: Never   Smokeless tobacco: Never  Vaping Use   Vaping status: Never Used  Substance and Sexual Activity   Alcohol use: No   Drug use: No   Sexual activity: Not on file  Other Topics Concern   Not on file  Social History Narrative   Not on file   Social Drivers of Health   Financial Resource Strain: Not on file  Food Insecurity: Not on file  Transportation Needs: Not on file  Physical Activity: Not on file  Stress: Not on file   Social Connections: Not on file  Intimate Partner Violence: Not on file    Outpatient Encounter Medications as of 05/29/2024  Medication Sig   pantoprazole  (PROTONIX ) 40 MG tablet Take 1 tablet (40 mg total) by mouth daily.   No facility-administered encounter medications on file as of 05/29/2024.    No Known Allergies  Review of Systems As per HPI  Objective:  BP 113/75   Pulse 86   Temp 98.5 F (36.9 C)   Ht 5' 8 (1.727 m)   Wt 155 lb (70.3 kg)   SpO2 97%   BMI 23.57 kg/m    Wt Readings from Last 3 Encounters:  05/29/24 155 lb (70.3 kg)  02/19/24 152 lb (68.9 kg)  11/09/23 154 lb (69.9 kg)   Physical Exam Constitutional:      General: He is awake. He is not in acute distress.    Appearance: Normal appearance. He is well-developed and well-groomed. He is not ill-appearing, toxic-appearing or diaphoretic.   Cardiovascular:     Rate and Rhythm: Normal rate and regular rhythm.     Heart sounds: Normal heart sounds. No murmur heard.    No gallop.  Pulmonary:     Effort: Pulmonary effort is normal. No respiratory distress.     Breath sounds: Normal breath sounds. No stridor. No wheezing, rhonchi or  rales.   Musculoskeletal:       Arms:     Cervical back: Full passive range of motion without pain and neck supple.     Comments: Localized swelling and tenderness to palpation    Skin:    General: Skin is warm.     Capillary Refill: Capillary refill takes less than 2 seconds.   Neurological:     General: No focal deficit present.     Mental Status: He is alert, oriented to person, place, and time and easily aroused. Mental status is at baseline.     GCS: GCS eye subscore is 4. GCS verbal subscore is 5. GCS motor subscore is 6.     Motor: No weakness.   Psychiatric:        Attention and Perception: Attention and perception normal.        Mood and Affect: Mood and affect normal.        Speech: Speech normal.        Behavior: Behavior normal. Behavior is  cooperative.        Thought Content: Thought content normal. Thought content does not include homicidal or suicidal ideation. Thought content does not include homicidal or suicidal plan.        Cognition and Memory: Cognition and memory normal.        Judgment: Judgment normal.      Results for orders placed or performed in visit on 11/09/23  Drug Screen 10 W/Conf, Serum   Collection Time: 11/09/23  9:50 AM  Result Value Ref Range   Amphetamines, IA Negative Cutoff:50 ng/mL   Barbiturates, IA Negative Cutoff:0.1 ug/mL   Benzodiazepines, IA Negative Cutoff:20 ng/mL   Cocaine & Metabolite, IA Negative Cutoff:25 ng/mL   Phencyclidine, IA Negative Cutoff:8 ng/mL   THC(Marijuana) Metabolite, IA Negative Cutoff:5 ng/mL   Opiates, IA Negative Cutoff:5 ng/mL   Oxycodones, IA Negative Cutoff:5 ng/mL   Methadone, IA Negative Cutoff:25 ng/mL   Propoxyphene, IA Negative Cutoff:50 ng/mL       05/29/2024    2:24 PM 02/19/2024    9:35 AM 11/09/2023    9:27 AM 07/23/2023    1:14 PM 03/26/2023   12:00 PM  Depression screen PHQ 2/9  Decreased Interest 0 0 0 0 0  Down, Depressed, Hopeless 0 0 0 0 0  PHQ - 2 Score 0 0 0 0 0  Altered sleeping   0 0 0  Tired, decreased energy   0 0 0  Change in appetite   0 0 0  Feeling bad or failure about yourself    0 0 0  Trouble concentrating   0 0 0  Moving slowly or fidgety/restless   0 0 0  Suicidal thoughts   0 0 0  PHQ-9 Score   0 0 0  Difficult doing work/chores   Not difficult at all Not difficult at all Not difficult at all       05/29/2024    2:24 PM 02/19/2024    9:35 AM 11/09/2023    9:27 AM 07/23/2023    1:14 PM  GAD 7 : Generalized Anxiety Score  Nervous, Anxious, on Edge 0 0 0 0  Control/stop worrying 0 0 0 0  Worry too much - different things 0 0 0 0  Trouble relaxing 0 0 0 0  Restless 0 0 0 0  Easily annoyed or irritable 0 0 0 0  Afraid - awful might happen 0 0 0 0  Total GAD 7 Score 0  0 0 0  Anxiety Difficulty Not difficult at all Not  difficult at all Not difficult at all Not difficult at all    Pertinent labs & imaging results that were available during my care of the patient were reviewed by me and considered in my medical decision making.  Assessment & Plan:  Sean Gilbert was seen today for left elbow swollen.  Diagnoses and all orders for this visit:  Localized swelling of left forearm Provided medication as below.  Continue RICE therapy.  Imaging as below to determine if there is ligament injury. Will communicate results to patient once available. Will await results to determine next steps.  -     naproxen  (NAPROSYN ) 500 MG tablet; Take 1 tablet (500 mg total) by mouth 2 (two) times daily with a meal. -     US  SOFT TISSUE UPPER EXTREMITY LIMITED LEFT (NON-VASCULAR); Future -     methylPREDNISolone  acetate (DEPO-MEDROL ) injection 40 mg  Continue all other maintenance medications.  Follow up plan: Return if symptoms worsen or fail to improve.   Continue healthy lifestyle choices, including diet (rich in fruits, vegetables, and lean proteins, and low in salt and simple carbohydrates) and exercise (at least 30 minutes of moderate physical activity daily).  Written and verbal instructions provided   The above assessment and management plan was discussed with the patient. The patient verbalized understanding of and has agreed to the management plan. Patient is aware to call the clinic if they develop any new symptoms or if symptoms persist or worsen. Patient is aware when to return to the clinic for a follow-up visit. Patient educated on when it is appropriate to go to the emergency department.   Jacqualyn Mates, DNP-FNP [facility] [address] [place], [place] [address] [phone]

## 2024-06-06 ENCOUNTER — Ambulatory Visit

## 2024-06-06 DIAGNOSIS — R2232 Localized swelling, mass and lump, left upper limb: Secondary | ICD-10-CM

## 2024-06-13 ENCOUNTER — Telehealth: Payer: Self-pay | Admitting: Nurse Practitioner

## 2024-06-13 ENCOUNTER — Other Ambulatory Visit: Payer: Self-pay | Admitting: Nurse Practitioner

## 2024-06-13 DIAGNOSIS — R2232 Localized swelling, mass and lump, left upper limb: Secondary | ICD-10-CM

## 2024-06-13 MED ORDER — NAPROXEN 500 MG PO TABS: 500.0000 mg | ORAL_TABLET | Freq: Two times a day (BID) | ORAL | 0 refills | Status: DC

## 2024-06-13 NOTE — Telephone Encounter (Signed)
 Copied from CRM [id number]. Topic: Clinical - Lab/Test Results >> Jun 13, 2024  1:05 PM Tiffany S wrote: Reason for CRM: Patient is requesting call back for imaging results please follow up with patient

## 2024-06-13 NOTE — Telephone Encounter (Signed)
 Copied from CRM [id number]. Topic: Clinical - Medication Refill >> Jun 13, 2024  1:02 PM Tiffany S wrote: Medication: naproxen  (NAPROSYN ) 500 MG tablet [[id number]]  Has the patient contacted their pharmacy? Yes (Agent: If no, request that the patient contact the pharmacy for the refill. If patient does not wish to contact the pharmacy document the reason why and proceed with request.) (Agent: If yes, when and what did the pharmacy advise?)  This is the patient's preferred pharmacy:   [id number] [place] [place] - [address] [place] [address] Phone: [phone] Fax: [phone]  Is this the correct pharmacy for this prescription? Yes If no, delete pharmacy and type the correct one.   Has the prescription been filled recently? Yes  Is the patient out of the medication? Yes  Has the patient been seen for an appointment in the last year OR does the patient have an upcoming appointment? Yes  Can we respond through MyChart? Yes  Agent: Please be advised that Rx refills may take up to 3 business days. We ask that you follow-up with your pharmacy.

## 2024-06-13 NOTE — Telephone Encounter (Unsigned)
 Copied from CRM [id number]. Topic: Clinical - Medication Refill >> Jun 13, 2024  1:02 PM Tiffany S wrote: Medication: naproxen  (NAPROSYN ) 500 MG tablet [[id number]]  Has the patient contacted their pharmacy? Yes (Agent: If no, request that the patient contact the pharmacy for the refill. If patient does not wish to contact the pharmacy document the reason why and proceed with request.) (Agent: If yes, when and what did the pharmacy advise?)  This is the patient's preferred pharmacy:   [id number] - [place] [place] - [address] [place] [address] Phone: [phone] Fax: [phone]  Is this the correct pharmacy for this prescription? Yes If no, delete pharmacy and type the correct one.   Has the prescription been filled recently? Yes  Is the patient out of the medication? Yes  Has the patient been seen for an appointment in the last year OR does the patient have an upcoming appointment? Yes  Can we respond through MyChart? Yes  Agent: Please be advised that Rx refills may take up to 3 business days. We ask that you follow-up with your pharmacy.

## 2024-06-16 NOTE — Telephone Encounter (Signed)
 Called [place] to move up study for reading

## 2024-06-16 NOTE — Telephone Encounter (Signed)
 Melissa, can you see if we can get radiology to review. The scan was on 6/20

## 2024-06-25 ENCOUNTER — Ambulatory Visit: Payer: Self-pay | Admitting: Family Medicine

## 2024-06-25 ENCOUNTER — Ambulatory Visit: Payer: Self-pay

## 2024-06-25 NOTE — Telephone Encounter (Signed)
 Appointment made for Friday 06/27/2024 at 9 AM with Cas Tracz-Margaret Gladis FNP  FYI Only or Action Required?: FYI only for provider.  Patient was last seen in primary care on 05/29/2024 by Cathlene Marry Lenis, FNP.  Called Nurse Triage reporting Lump on Left Forearm.  Symptoms began Over a month.  Interventions attempted: Other: PCP Office Visit on 05/29/2024 followed by an Ultrasound of the area.  Symptoms are: unchanged.  Triage Disposition: See PCP When Office is Open (Within 3 Days)  Patient/caregiver understands and will follow disposition?: Yes          1. APPEARANCE of SWELLING: What does it look like?  Swelling up and down smaller than a golf ball  2. SIZE: How large is the swelling? (e.g., inches, cm; or compare to size of pinhead, tip of pen, eraser, coin, pea, grape, ping pong ball)   ----  3. LOCATION: Where is the swelling located?  Left forearm near elbow---started out with some slight elbow pain  4. ONSET: When did the swelling start? 5. COLOR: What color is it? Is there more than one color? 6. PAIN: Is there any pain? If Yes, ask: How bad is the pain? (Scale 1-10; or mild, moderate, severe)  - NONE (0): No pain. - MILD (1-3): Doesn't interfere with normal activities.  - MODERATE (4-7): Interferes with normal activities or awakens from sleep.  - SEVERE (8-10): Excruciating pain, unable to do any normal activities.  Mild---uncomfortable   7. ITCH: Does it itch? If Yes, ask: How bad is the itch?   No   8. CAUSE: What do you think caused the swelling?  Unsure  9 OTHER SYMPTOMS: Do you have any other symptoms? (e.g., fever)   No                          Copied from CRM [id number]. Topic: Clinical - Red Word Triage >> Jun 25, 2024 12:33 PM Willma R wrote: Red Word that prompted transfer to Nurse Triage: Patient called to advise that he still has swelling/a lump on the crease of his forearm and its still  the size of a golf ball. Had the ultrasound done and they were unable to find anything. Patient says that the lump still comes and goes. Reason for Disposition  [1] Small swelling or lump AND [2] unexplained AND [3] present > 1 week  Protocols used: Skin Lump or Localized Swelling-A-AH

## 2024-06-27 ENCOUNTER — Encounter: Payer: Self-pay | Admitting: Nurse Practitioner

## 2024-06-27 ENCOUNTER — Ambulatory Visit: Admitting: Nurse Practitioner

## 2024-07-04 ENCOUNTER — Encounter: Payer: Self-pay | Admitting: Advanced Practice Midwife

## 2024-07-08 ENCOUNTER — Telehealth: Payer: Self-pay | Admitting: Nurse Practitioner

## 2024-07-08 ENCOUNTER — Other Ambulatory Visit: Payer: Self-pay | Admitting: Nurse Practitioner

## 2024-07-08 ENCOUNTER — Telehealth: Payer: Self-pay

## 2024-07-08 ENCOUNTER — Telehealth (INDEPENDENT_AMBULATORY_CARE_PROVIDER_SITE_OTHER): Admitting: Nurse Practitioner

## 2024-07-08 ENCOUNTER — Encounter: Payer: Self-pay | Admitting: Nurse Practitioner

## 2024-07-08 DIAGNOSIS — N200 Calculus of kidney: Secondary | ICD-10-CM

## 2024-07-08 DIAGNOSIS — N3001 Acute cystitis with hematuria: Secondary | ICD-10-CM | POA: Insufficient documentation

## 2024-07-08 MED ORDER — KETOROLAC TROMETHAMINE 10 MG PO TABS: 10.0000 mg | ORAL_TABLET | Freq: Three times a day (TID) | ORAL | 0 refills | Status: DC | PRN

## 2024-07-08 NOTE — Telephone Encounter (Signed)
 Copied from CRM [id number]. Topic: General - Other >> Jul 08, 2024 10:31 AM Nathanel BROCKS wrote: Reason for CRM:   Pt called back in after appt with Ms. Sebastian and is upset saying that he had a video visit about kidney stones. He said that Ms Sebastian would not approve a drs note for him for missing work. He stated that he never wants to see her again and he still needs a note. >> Jul 08, 2024  2:53 PM Donna E wrote: Patient calling back, he  is frustrated and asking to speak with a nurse about this issue. Medication was sent to wrong pharmacy.  Patient is wondering why he hasn't gotten a call back and really wants to speak with a nurse.

## 2024-07-08 NOTE — Telephone Encounter (Signed)
 Copied from CRM [id number]. Topic: General - Other >> Jul 08, 2024 10:31 AM Nathanel BROCKS wrote: Reason for CRM:   Pt called back in after appt with Ms. Sebastian and is upset saying that he had a video visit about kidney stones. He said that Ms Sebastian would not approve a drs note for him for missing work. He stated that he never wants to see her again and he still needs a note.

## 2024-07-08 NOTE — Progress Notes (Signed)
 Virtual Visit via video Note Due to COVID-19 pandemic this visit was conducted virtually. This visit type was conducted due to national recommendations for restrictions regarding the COVID-19 Pandemic (e.g. social distancing, sheltering in place) in an effort to limit this patient's exposure and mitigate transmission in our community. All issues noted in this document were discussed and addressed.  A physical exam was not performed with this format.   I connected with Sean Gilbert is a 28 yrs old seen on 07/08/2024 at 1015 by name and DOB and verified that I am speaking with the correct person using two identifiers. Sean Gilbert is currently located at in his car during visit. The provider, Nena Deitra Morton Sebastian, DNP is located in their office at time of visit.  I discussed the limitations, risks, security and privacy concerns of performing an evaluation and management service by virtual visit and the availability of in person appointments. I also discussed with the patient that there may be a patient responsible charge related to this service. The patient expressed understanding and agreed to proceed.  Subjective:  Patient ID: Sean Gilbert, male    DOB: October 25, 1996, 28 y.o.   MRN: [id number]  Chief Complaint:   need a Dr note becasue I didn't got to work on Sunday 7/20//2025    HPI: Sean Gilbert is a 28 y.o. male presenting on 07/08/2024 for  need a dr note becasue I didn't got ot work on 7/2//2025   The patient presents today requesting a work excuse note related to a missed shift on Sunday due to kidney stone-related pain. He reports that he was seen virtually by his PCP on 11/29/2023 and was referred to Urology by the ED; however, he has not yet followed up and states, I still haven't passed the stones. He has been taking Naproxen  for back pain, which provides some relief, but reports developing mouth sores as a side effect: I get mouth sores when I take  it. He is requesting alternative pain management. The patient was advised that since he was not evaluated in the clinic on the day he missed work (Sunday), a retroactive excuse note cannot be provided. He was informed that a note can only be issued for today's visit. Patient responded, Not sure why I'm seeing you then -- that was the main reason for today's visit.  Relevant past medical, surgical, family, and social history reviewed and updated as indicated.  Allergies and medications reviewed and updated.   Past Medical History:  Diagnosis Date   Allergy     Past Surgical History:  Procedure Laterality Date   TONSILLECTOMY     tubes in ears      Social History   Socioeconomic History   Marital status: Single    Spouse name: Not on file   Number of children: Not on file   Years of education: Not on file   Highest education level: Not on file  Occupational History   Not on file  Tobacco Use   Smoking status: Never   Smokeless tobacco: Never  Vaping Use   Vaping status: Never Used  Substance and Sexual Activity   Alcohol use: No   Drug use: No   Sexual activity: Not on file  Other Topics Concern   Not on file  Social History Narrative   Not on file   Social Drivers of Health   Financial Resource Strain: Not on file  Food Insecurity: Not on file  Transportation Needs: Not  on file  Physical Activity: Not on file  Stress: Not on file  Social Connections: Not on file  Intimate Partner Violence: Not on file    Outpatient Encounter Medications as of 07/08/2024  Medication Sig   ketorolac  (TORADOL ) 10 MG tablet Take 1 tablet (10 mg total) by mouth every 8 (eight) hours as needed.   pantoprazole  (PROTONIX ) 40 MG tablet Take 1 tablet (40 mg total) by mouth daily.   [DISCONTINUED] naproxen  (NAPROSYN ) 500 MG tablet Take 1 tablet (500 mg total) by mouth 2 (two) times daily with a meal.   No facility-administered encounter medications on file as of 07/08/2024.    No  Known Allergies  Review of Systems  Constitutional:  Negative for chills and fatigue.  HENT:  Negative for congestion and sinus pain.   Cardiovascular:  Negative for chest pain and palpitations.  Gastrointestinal:  Negative for nausea and vomiting.  Musculoskeletal:  Positive for back pain.       Chronic  Skin:  Negative for color change and rash.  Neurological:  Negative for dizziness.    Observations/Objective: No vital signs or physical exam, this was a virtual health encounter.  Pt alert and oriented, answers all questions appropriately, and able to speak in full sentences.   Physical Exam Constitutional:      General: He is not in acute distress. HENT:     Head: Normocephalic and atraumatic.  Eyes:     Extraocular Movements: Extraocular movements intact.     Conjunctiva/sclera: Conjunctivae normal.  Pulmonary:     Effort: Pulmonary effort is normal.  Neurological:     Mental Status: He is alert and oriented to person, place, and time.  Psychiatric:        Mood and Affect: Mood normal.        Behavior: Behavior normal.        Judgment: Judgment normal.     Assessment and Plan: Sean Gilbert was seen today for  need a dr note becasue i didn't got ot work on 7/2//2025 .  Diagnoses and all orders for this visit:  Kidney stone -     ketorolac  (TORADOL ) 10 MG tablet; Take 1 tablet (10 mg total) by mouth every 8 (eight) hours as needed.    Sean Gilbert is a 28 year old Caucasian male seen today via telehealth from kidney stone, no acute distress Toradol  10 mg every 8 hours as needed, increase hydration; work note for today on MyChart for patient. Follow-up with urology as scheduled Follow Up Instructions: Return if symptoms worsen or fail to improve.    I discussed the assessment and treatment plan with the patient. The patient was provided an opportunity to ask questions and all were answered. The patient agreed with the plan and demonstrated an understanding  of the instructions.   The patient was advised to call back or seek an in-person evaluation if the symptoms worsen or if the condition fails to improve as anticipated.  The above assessment and management plan was discussed with the patient. The patient verbalized understanding of and has agreed to the management plan. Patient is aware to call the clinic if they develop any new symptoms or if symptoms persist or worsen. Patient is aware when to return to the clinic for a follow-up visit. Patient educated on when it is appropriate to go to the emergency department.    I provided 12 minutes of time during this video encounter.   Nikolina Simerson [place], [place] [facility] [address]  [address] [place], [place] [address] [phone] 07/08/2024

## 2024-07-08 NOTE — Telephone Encounter (Signed)
 Name from pharmacy: KETOROLAC  10 MG TABLET  Pharmacy comment: Alternative Requested:PA.

## 2024-07-08 NOTE — Telephone Encounter (Signed)
 Attempted to call pt no answer and voicemail is not set up yet. If pt calls back please ask what pharmacy he needs medication sent to and let him know his work note was sent through [employer].

## 2024-07-08 NOTE — Telephone Encounter (Signed)
 Pt wants to talk to nurse about his work note and that rx was sent to wrong pharmacy. Please call back

## 2024-07-08 NOTE — Telephone Encounter (Signed)
 Copied from CRM #[id number]. Topic: General - Other >> Jul 08, 2024  3:37 PM Antwanette L wrote: Reason for CRM: The patient is calling back he had a missed call from [facility]. The patient uses [facility] on [address] [place], [place], [address]   [facility] Details [phone] [place] [place] [address] [place] [address] Phone: [phone] Fax: [phone] Hours: Not open 24 hours

## 2024-07-09 ENCOUNTER — Other Ambulatory Visit: Payer: Self-pay | Admitting: *Deleted

## 2024-07-09 DIAGNOSIS — N200 Calculus of kidney: Secondary | ICD-10-CM

## 2024-07-09 DIAGNOSIS — K2100 Gastro-esophageal reflux disease with esophagitis, without bleeding: Secondary | ICD-10-CM

## 2024-07-09 MED ORDER — PANTOPRAZOLE SODIUM 40 MG PO TBEC: 40.0000 mg | DELAYED_RELEASE_TABLET | Freq: Every day | ORAL | 3 refills | Status: DC

## 2024-07-09 MED ORDER — KETOROLAC TROMETHAMINE 10 MG PO TABS
10.0000 mg | ORAL_TABLET | Freq: Three times a day (TID) | ORAL | 0 refills | Status: AC | PRN
Start: 2024-07-09 — End: ?

## 2024-07-09 NOTE — Telephone Encounter (Signed)
 [facility] [place]

## 2024-07-09 NOTE — Telephone Encounter (Signed)
 Patient active on Mychart. Mychart message sent.

## 2024-11-26 ENCOUNTER — Other Ambulatory Visit: Payer: Self-pay | Admitting: Nurse Practitioner

## 2024-11-26 DIAGNOSIS — K2100 Gastro-esophageal reflux disease with esophagitis, without bleeding: Secondary | ICD-10-CM
